# Patient Record
Sex: Female | Born: 1970 | Race: White | Hispanic: No | Marital: Married | Smoking: Never smoker
Health system: Southern US, Community
[De-identification: ages and names within clinical notes are randomized; demographics above are authoritative.]

## PROBLEM LIST (undated history)

## (undated) DIAGNOSIS — I10 Essential (primary) hypertension: Secondary | ICD-10-CM

## (undated) DIAGNOSIS — Z8639 Personal history of other endocrine, nutritional and metabolic disease: Secondary | ICD-10-CM

## (undated) DIAGNOSIS — H409 Unspecified glaucoma: Secondary | ICD-10-CM

## (undated) DIAGNOSIS — N809 Endometriosis, unspecified: Secondary | ICD-10-CM

## (undated) DIAGNOSIS — F418 Other specified anxiety disorders: Secondary | ICD-10-CM

## (undated) DIAGNOSIS — N8110 Cystocele, unspecified: Secondary | ICD-10-CM

## (undated) HISTORY — DX: Cystocele, unspecified: N81.10

## (undated) HISTORY — DX: Unspecified glaucoma: H40.9

## (undated) HISTORY — DX: Personal history of other endocrine, nutritional and metabolic disease: Z86.39

## (undated) HISTORY — DX: Essential (primary) hypertension: I10

## (undated) HISTORY — DX: Other specified anxiety disorders: F41.8

---
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
## 2009-05-31 DIAGNOSIS — Z8639 Personal history of other endocrine, nutritional and metabolic disease: Secondary | ICD-10-CM | POA: Insufficient documentation

## 2009-05-31 HISTORY — DX: Personal history of other endocrine, nutritional and metabolic disease: Z86.39

## 2012-04-17 ENCOUNTER — Encounter: Payer: Self-pay | Admitting: Family

## 2012-04-17 ENCOUNTER — Ambulatory Visit (INDEPENDENT_AMBULATORY_CARE_PROVIDER_SITE_OTHER): Payer: BC Managed Care – PPO | Admitting: Family

## 2012-04-17 ENCOUNTER — Other Ambulatory Visit: Payer: Self-pay | Admitting: Family

## 2012-04-17 VITALS — BP 112/86 | HR 79 | Temp 98.5°F | Resp 16 | Ht 62.0 in | Wt 192.1 lb

## 2012-04-17 DIAGNOSIS — E041 Nontoxic single thyroid nodule: Secondary | ICD-10-CM | POA: Insufficient documentation

## 2012-04-17 DIAGNOSIS — Z1231 Encounter for screening mammogram for malignant neoplasm of breast: Secondary | ICD-10-CM

## 2012-04-17 DIAGNOSIS — H409 Unspecified glaucoma: Secondary | ICD-10-CM | POA: Insufficient documentation

## 2012-04-17 DIAGNOSIS — Z23 Encounter for immunization: Secondary | ICD-10-CM

## 2012-04-17 DIAGNOSIS — Z0000 Encounter for general adult medical examination without abnormal findings: Secondary | ICD-10-CM

## 2012-04-17 HISTORY — DX: Unspecified glaucoma: H40.9

## 2012-04-17 MED ORDER — FLUTICASONE PROPIONATE 50 MCG/ACT NA SUSP: 2.0000 | Freq: Every day | NASAL | Status: DC | PRN

## 2012-04-17 NOTE — Patient Instructions (Addendum)
Schedule mammogram and ultrasound on the floor. Return to the lab fasting for fasting blood work.  Follow up as needed.

## 2012-04-17 NOTE — Progress Notes (Signed)
Subjective:    Patient ID: Maria [name], female    DOB: Oct 26, 1970, 41 y.o.   MRN: [id number]  HPI  Patient presents today for complete physical.  Immunizations: last tetanus?, wants flu shot Diet: fair Exercise: Not currently exercising.  Pap Smear: 1 year ago- reports normal pap smear. Mammogram: Last mammogram- >1 year  Hx of thyroid nodule- this was reported on [place] 2/11. Was told that it was "small.  Never did have a follow up US for monitoring.   Review of Systems  Constitutional: Negative for unexpected weight change.  HENT: Negative for congestion.   Eyes: Negative for visual disturbance.       Reports hx of lasik 10 yrs ago. No longer 20/20  Gastrointestinal: Negative for vomiting, diarrhea and constipation.  Genitourinary: Negative for dysuria.  Musculoskeletal: Negative for myalgias and arthralgias.  Skin: Negative for rash.  Neurological: Negative for headaches.       Reports + sinus headaches.  Uses flonase. Gets headaches once a month with her periods  Hematological: Negative for adenopathy.  Psychiatric/Behavioral:       Denies depression anxiety   Past Medical History  Diagnosis Date  . History of thyroid nodule 2011  . 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 Marital Status: Married    Spouse Name: N/A    Number of Children: 2  . Years of Education: N/A   Occupational History  . Not on file.   Social History Main Topics  . Smoking status: Never Smoker   . Smokeless tobacco: Never Used  . Alcohol Use: Yes     Comment: social /occasional  . Drug Use: Not on file  . Sexually Active: Not on file   Other Topics Concern  . Not on file   Social History Narrative   Regular exercise:  Walks once a weekCaffeine use:  1-2 cups coffee dailyShe works from home for [employer] crafting/crocheting/[employer]    Past Surgical History  Procedure Date  . No past surgeries     Family History  Problem  Relation Age of Onset  . Hyperlipidemia Maternal Grandmother   . Hypertension Maternal Grandmother   . Diabetes Maternal Grandfather   . Psoriasis Maternal Grandfather   . Heart disease Maternal Grandfather   . Heart disease Paternal Grandfather   . Cancer Neg Hx     Allergies  Allergen Reactions  . Asa (Aspirin) Nausea And Vomiting    Current Outpatient Prescriptions on File Prior to Visit  Medication Sig Dispense Refill  . fluticasone (FLONASE) 50 MCG/ACT nasal spray Place 2 sprays into the nose daily as needed.        BP 112/86  Pulse 79  Temp 98.5 F (36.9 C) (Oral)  Resp 16  Ht 5\' 2"  (1.575 m)  Wt 192 lb 1.9 oz (87.145 kg)  BMI 35.14 kg/m2  SpO2 98%  LMP 03/17/2012       Objective:   Physical Exam Physical Exam  Constitutional: She is oriented to person, place, and time. She appears well-developed and well-nourished. No distress.  HENT:  Head: Normocephalic and atraumatic.  Right Ear: Tympanic membrane and ear canal normal.  Left Ear: Tympanic membrane and ear canal normal.  Mouth/Throat: Oropharynx is clear and moist.  Eyes: Pupils are equal, round, and reactive to light. No scleral icterus.  Neck: Normal range of motion. No thyromegaly present.  Cardiovascular: Normal rate and regular rhythm.   No murmur heard.  Pulmonary/Chest: Effort normal and breath sounds normal. No respiratory distress. He has no wheezes. She has no rales. She exhibits no tenderness.  Abdominal: Soft. Bowel sounds are normal. He exhibits no distension and no mass. There is no tenderness. There is no rebound and no guarding.  Musculoskeletal: She exhibits no edema.  Lymphadenopathy:    She has no cervical adenopathy.  Neurological: She is alert and oriented to person, place, and time. She has normal reflexes. She exhibits normal muscle tone. Coordination normal.  Skin: Skin is warm and dry.  Psychiatric: She has a normal mood and affect. Her behavior is normal. Judgment and thought  content normal.  Breasts: Examined lying Right: Without masses, retractions, discharge or axillary adenopathy.  Left: Without masses, retractions, discharge or axillary adenopathy.  Pelvic: deferred          Assessment & Plan:          Assessment & Plan:

## 2012-04-17 NOTE — Assessment & Plan Note (Signed)
Pt counseled on diet, exercise and weight loss. Will refer for mammogram.  She will return fasting for blood work.  Plan pap next year.  Obtain old records.  She thinks last tetanus is <10 years ago.

## 2012-04-17 NOTE — Assessment & Plan Note (Signed)
Obtain old records, will order follow up ultrasound.

## 2012-04-25 ENCOUNTER — Ambulatory Visit (HOSPITAL_BASED_OUTPATIENT_CLINIC_OR_DEPARTMENT_OTHER)
Admission: RE | Admit: 2012-04-25 | Discharge: 2012-04-25 | Disposition: A | Discharge status: Home / Self Care | Payer: BC Managed Care – PPO | Source: Ambulatory Visit | Attending: Family | Admitting: Family

## 2012-04-25 ENCOUNTER — Other Ambulatory Visit (HOSPITAL_BASED_OUTPATIENT_CLINIC_OR_DEPARTMENT_OTHER): Payer: BC Managed Care – PPO

## 2012-04-25 ENCOUNTER — Encounter: Payer: Self-pay | Admitting: Family

## 2012-04-25 DIAGNOSIS — Z1231 Encounter for screening mammogram for malignant neoplasm of breast: Secondary | ICD-10-CM

## 2012-04-25 DIAGNOSIS — E041 Nontoxic single thyroid nodule: Secondary | ICD-10-CM

## 2012-04-25 LAB — CBC WITH DIFFERENTIAL/PLATELET
Basophils Absolute: 0 10*3/uL (ref 0.0–0.1)
Eosinophils Absolute: 0.2 10*3/uL (ref 0.0–0.7)
Lymphocytes Relative: 25 % (ref 12–46)
Lymphs Abs: 2.2 10*3/uL (ref 0.7–4.0)
Neutrophils Relative %: 68 % (ref 43–77)
Platelets: 331 10*3/uL (ref 150–400)
RBC: 4.88 MIL/uL (ref 3.87–5.11)
WBC: 8.6 10*3/uL (ref 4.0–10.5)

## 2012-04-25 LAB — BASIC METABOLIC PANEL WITH GFR
Calcium: 9.1 mg/dL (ref 8.4–10.5)
GFR, Est African American: >89 mL/min
Sodium: 139 mEq/L (ref 135–145)

## 2012-04-25 LAB — HEPATIC FUNCTION PANEL
Albumin: 3.9 g/dL (ref 3.5–5.2)
Alkaline Phosphatase: 55 U/L (ref 39–117)
Total Bilirubin: 0.5 mg/dL (ref 0.3–1.2)

## 2012-04-25 LAB — LIPID PANEL
Cholesterol: 195 mg/dL (ref 0–200)
HDL: 39 mg/dL — ABNORMAL LOW (ref >39)
LDL Cholesterol: 120 mg/dL — ABNORMAL HIGH (ref 0–99)
Triglycerides: 181 mg/dL — ABNORMAL HIGH (ref <150)

## 2012-04-26 ENCOUNTER — Telehealth: Payer: Self-pay | Admitting: Family

## 2012-04-26 LAB — URINALYSIS, ROUTINE W REFLEX MICROSCOPIC
Bilirubin Urine: NEGATIVE
Glucose, UA: NEGATIVE mg/dL
Ketones, ur: NEGATIVE mg/dL
Nitrite: NEGATIVE
pH: 5.5 (ref 5.0–8.0)

## 2012-04-26 LAB — URINALYSIS, MICROSCOPIC ONLY
Bacteria, UA: NONE SEEN
Casts: NONE SEEN

## 2012-04-26 NOTE — Telephone Encounter (Signed)
When pt returns call re: thyroid US, please also let her know that radiology is requesting some additional images of the left breast.  They should be contacting her within the next week to arrange these additional studies.  All of her labs look good, except triglycerides are mildly elevated. She can work on this by avoiding concentrated sweets and white fluffy carbs.

## 2012-04-26 NOTE — Telephone Encounter (Signed)
Left message on home # to return my call. 

## 2012-04-28 ENCOUNTER — Other Ambulatory Visit: Payer: Self-pay | Admitting: Family

## 2012-04-28 DIAGNOSIS — R928 Other abnormal and inconclusive findings on diagnostic imaging of breast: Secondary | ICD-10-CM

## 2012-05-05 ENCOUNTER — Ambulatory Visit (HOSPITAL_BASED_OUTPATIENT_CLINIC_OR_DEPARTMENT_OTHER): Payer: BC Managed Care – PPO

## 2012-05-05 ENCOUNTER — Inpatient Hospital Stay (HOSPITAL_BASED_OUTPATIENT_CLINIC_OR_DEPARTMENT_OTHER): Admission: RE | Admit: 2012-05-05 | Payer: BC Managed Care – PPO | Source: Ambulatory Visit

## 2012-05-08 NOTE — Telephone Encounter (Signed)
Notified pt. 

## 2012-05-09 ENCOUNTER — Ambulatory Visit
Admission: RE | Admit: 2012-05-09 | Discharge: 2012-05-09 | Disposition: A | Discharge status: Home / Self Care | Payer: BC Managed Care – PPO | Source: Ambulatory Visit | Attending: Family | Admitting: Family

## 2012-05-09 DIAGNOSIS — R928 Other abnormal and inconclusive findings on diagnostic imaging of breast: Secondary | ICD-10-CM

## 2013-06-27 ENCOUNTER — Other Ambulatory Visit: Payer: Self-pay | Admitting: Family

## 2013-06-27 ENCOUNTER — Other Ambulatory Visit (HOSPITAL_BASED_OUTPATIENT_CLINIC_OR_DEPARTMENT_OTHER): Payer: Self-pay | Admitting: Family

## 2013-06-27 DIAGNOSIS — Z1231 Encounter for screening mammogram for malignant neoplasm of breast: Secondary | ICD-10-CM

## 2013-07-04 ENCOUNTER — Ambulatory Visit (HOSPITAL_BASED_OUTPATIENT_CLINIC_OR_DEPARTMENT_OTHER)
Admission: RE | Admit: 2013-07-04 | Discharge: 2013-07-04 | Disposition: A | Discharge status: Home / Self Care | Payer: Managed Care, Other (non HMO) | Source: Ambulatory Visit | Attending: Family | Admitting: Family

## 2013-07-04 DIAGNOSIS — Z1231 Encounter for screening mammogram for malignant neoplasm of breast: Secondary | ICD-10-CM | POA: Insufficient documentation

## 2014-03-28 ENCOUNTER — Encounter: Payer: Self-pay | Admitting: Family

## 2014-03-28 ENCOUNTER — Ambulatory Visit (INDEPENDENT_AMBULATORY_CARE_PROVIDER_SITE_OTHER): Payer: Managed Care, Other (non HMO) | Admitting: Family

## 2014-03-28 VITALS — BP 145/94 | HR 78 | Temp 98.2°F | Wt 200.0 lb

## 2014-03-28 DIAGNOSIS — Z23 Encounter for immunization: Secondary | ICD-10-CM

## 2014-03-28 DIAGNOSIS — I10 Essential (primary) hypertension: Secondary | ICD-10-CM

## 2014-03-28 MED ORDER — HYDROCHLOROTHIAZIDE 25 MG PO TABS: 25.0000 mg | ORAL_TABLET | Freq: Every day | ORAL | Status: DC

## 2014-03-28 NOTE — Progress Notes (Signed)
   Subjective:    Patient ID: Maria [name], female    DOB: 08/29/1970, 43 y.o.   MRN: [id number]  HPI  Ms. Maria [name] is a 43 yr old female who presents today to discuss elevated blood pressure.  She completed a Wellness screening for work yesterday and had the following BP's,174/101 161/114, 149/108.  BP Readings from Last 3 Encounters:  03/28/14 145/94  04/17/12 112/86   Was taking sinus headache medication- took Tuesday AM  She reports that she has gained some weight, walks the dogs but no formal exercise. Wt Readings from Last 3 Encounters:  03/28/14 200 lb (90.719 kg)  04/17/12 192 lb 1.9 oz (87.145 kg)     Review of Systems  Respiratory: Negative for shortness of breath.   Cardiovascular: Negative for chest pain.  Neurological: Positive for headaches.   Past Medical History  Diagnosis Date  . History of thyroid nodule 2011  . 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 Marital Status: Married    Spouse Name: N/A    Number of Children: 2  . Years of Education: N/A   Occupational History  . Not on file.   Social History Main Topics  . Smoking status: Never Smoker   . Smokeless tobacco: Never Used  . Alcohol Use: Yes     Comment: social /occasional  . Drug Use: Not on file  . Sexual Activity: Not on file   Other Topics Concern  . Not on file   Social History Narrative   Regular exercise:  Walks once a week   Caffeine use:  1-2 cups coffee daily   She works from home for [employer]   Married   2 children   Enjoys crafting/crocheting/[employer]ascar    Past Surgical History  Procedure Laterality Date  . No past surgeries      Family History  Problem Relation Age of Onset  . Hyperlipidemia Maternal Grandmother   . Hypertension Maternal Grandmother   . Diabetes Maternal Grandfather   . Psoriasis Maternal Grandfather   . Heart disease Maternal Grandfather   . Heart disease Paternal Grandfather   . Cancer Neg Hx     Allergies    Allergen Reactions  . Asa [Aspirin] Nausea And Vomiting    Current Outpatient Prescriptions on File Prior to Visit  Medication Sig Dispense Refill  . fluticasone (FLONASE) 50 MCG/ACT nasal spray Place 2 sprays into the nose daily as needed.  16 g  5   No current facility-administered medications on file prior to visit.    BP 145/94  Pulse 78  Temp(Src) 98.2 F (36.8 C)  Wt 200 lb (90.719 kg)  SpO2 98%       Objective:   Physical Exam  Constitutional: She is oriented to person, place, and time. She appears well-developed and well-nourished. No distress.  Cardiovascular: Normal rate and regular rhythm.   No murmur heard. Pulmonary/Chest: Effort normal and breath sounds normal. No respiratory distress. She has no wheezes. She has no rales. She exhibits no tenderness.  Musculoskeletal: She exhibits no edema.  Neurological: She is alert and oriented to person, place, and time.  Psychiatric: She has a normal mood and affect. Her behavior is normal. Judgment and thought content normal.          Assessment & Plan:

## 2014-03-28 NOTE — Assessment & Plan Note (Signed)
New. Advised pt to avoid medications containing decongestants, focus on low sodium diet, exercise, weight loss. Will initiate hctz, follow up in 2 weeks for BP recheck and BMET.

## 2014-03-28 NOTE — Patient Instructions (Addendum)
Start hctz once daily.  Work on low sodium diet, exercise, weight loss.  Follow up in 2 weeks.

## 2014-03-28 NOTE — Progress Notes (Signed)
Pre visit review using our clinic review tool, if applicable. No additional management support is needed unless otherwise documented below in the visit note. 

## 2014-04-30 ENCOUNTER — Other Ambulatory Visit (HOSPITAL_COMMUNITY)
Admission: RE | Admit: 2014-04-30 | Discharge: 2014-04-30 | Disposition: A | Discharge status: Home / Self Care | Payer: Managed Care, Other (non HMO) | Source: Ambulatory Visit | Attending: Family | Admitting: Family

## 2014-04-30 ENCOUNTER — Ambulatory Visit (INDEPENDENT_AMBULATORY_CARE_PROVIDER_SITE_OTHER): Payer: Managed Care, Other (non HMO) | Admitting: Family

## 2014-04-30 ENCOUNTER — Encounter: Payer: Self-pay | Admitting: Family

## 2014-04-30 VITALS — BP 130/90 | HR 76 | Temp 98.1°F | Resp 16 | Ht 63.0 in | Wt 205.4 lb

## 2014-04-30 DIAGNOSIS — Z0000 Encounter for general adult medical examination without abnormal findings: Secondary | ICD-10-CM

## 2014-04-30 DIAGNOSIS — Z01419 Encounter for gynecological examination (general) (routine) without abnormal findings: Secondary | ICD-10-CM | POA: Insufficient documentation

## 2014-04-30 DIAGNOSIS — I10 Essential (primary) hypertension: Secondary | ICD-10-CM

## 2014-04-30 DIAGNOSIS — Z23 Encounter for immunization: Secondary | ICD-10-CM

## 2014-04-30 LAB — URINALYSIS, ROUTINE W REFLEX MICROSCOPIC
BILIRUBIN URINE: NEGATIVE
Hgb urine dipstick: NEGATIVE
Ketones, ur: NEGATIVE
LEUKOCYTES UA: NEGATIVE
NITRITE: NEGATIVE
PH: 6.5 (ref 5.0–8.0)
RBC / HPF: NONE SEEN (ref 0–?)
SPECIFIC GRAVITY, URINE: 1.015 (ref 1.000–1.030)
Total Protein, Urine: NEGATIVE
UROBILINOGEN UA: 0.2 (ref 0.0–1.0)
Urine Glucose: NEGATIVE
WBC, UA: NONE SEEN (ref 0–?)

## 2014-04-30 LAB — BASIC METABOLIC PANEL
BUN: 9 mg/dL (ref 6–23)
CHLORIDE: 105 meq/L (ref 96–112)
CO2: 24 meq/L (ref 19–32)
Calcium: 8.4 mg/dL (ref 8.4–10.5)
Creatinine, Ser: 0.7 mg/dL (ref 0.4–1.2)
GFR: 90.34 mL/min (ref >60.00)
GLUCOSE: 97 mg/dL (ref 70–99)
POTASSIUM: 3.8 meq/L (ref 3.5–5.1)
SODIUM: 137 meq/L (ref 135–145)

## 2014-04-30 LAB — CBC WITH DIFFERENTIAL/PLATELET
BASOS ABS: 0 10*3/uL (ref 0.0–0.1)
Basophils Relative: 0.5 % (ref 0.0–3.0)
EOS ABS: 0.2 10*3/uL (ref 0.0–0.7)
Eosinophils Relative: 2.1 % (ref 0.0–5.0)
HEMATOCRIT: 38.7 % (ref 36.0–46.0)
HEMOGLOBIN: 12.6 g/dL (ref 12.0–15.0)
LYMPHS ABS: 2 10*3/uL (ref 0.7–4.0)
Lymphocytes Relative: 24.2 % (ref 12.0–46.0)
MCHC: 32.6 g/dL (ref 30.0–36.0)
MCV: 87.6 fl (ref 78.0–100.0)
Monocytes Absolute: 0.4 10*3/uL (ref 0.1–1.0)
Monocytes Relative: 5 % (ref 3.0–12.0)
NEUTROS ABS: 5.8 10*3/uL (ref 1.4–7.7)
Neutrophils Relative %: 68.2 % (ref 43.0–77.0)
Platelets: 297 10*3/uL (ref 150.0–400.0)
RBC: 4.42 Mil/uL (ref 3.87–5.11)
RDW: 13.5 % (ref 11.5–15.5)
WBC: 8.5 10*3/uL (ref 4.0–10.5)

## 2014-04-30 LAB — HEPATIC FUNCTION PANEL
ALBUMIN: 3.6 g/dL (ref 3.5–5.2)
ALT: 25 U/L (ref 0–35)
AST: 19 U/L (ref 0–37)
Alkaline Phosphatase: 55 U/L (ref 39–117)
Bilirubin, Direct: 0 mg/dL (ref 0.0–0.3)
TOTAL PROTEIN: 6.6 g/dL (ref 6.0–8.3)
Total Bilirubin: 0.4 mg/dL (ref 0.2–1.2)

## 2014-04-30 LAB — TSH: TSH: 3.03 u[IU]/mL (ref 0.35–4.50)

## 2014-04-30 MED ORDER — AMLODIPINE BESYLATE 5 MG PO TABS: 2.5000 mg | ORAL_TABLET | Freq: Every day | ORAL | Status: DC

## 2014-04-30 NOTE — Addendum Note (Signed)
Addended by: Silvio PateHOMPSON, Janett Kamath D on: 04/30/2014 08:28 AM   Modules accepted: Orders

## 2014-04-30 NOTE — Assessment & Plan Note (Signed)
Discussed healthy diet, exercise, weight loss.  Tdap today. Pap performed today.

## 2014-04-30 NOTE — Assessment & Plan Note (Signed)
Home dbp readings have been >90 on most readings. Will continue hctz, add amlodipine 2.5mg  once daily. Pt to check daily x 1 week and send me her readings via mychart.

## 2014-04-30 NOTE — Patient Instructions (Addendum)
Add amlodipine 5 mg 1/2 tab by mouth once daily. Continue hctz.   Follow up in 3 months.

## 2014-04-30 NOTE — Progress Notes (Signed)
Subjective:    Patient ID: Maria [name], female    DOB: 05/03/1971, 43 y.o.   MRN: [id number]  HPI  Maria [name] is a 43 yr old female who presents today for cpx.  Patient presents today for complete physical.  Immunizations: up to date.  Diet: portion control, plans to work hard on this 1/1 Exercise:  Walks her dog at lunch and before/after dinner- 1 mile a day Pap Smear: due, periods  Mammogram: 2/15- normal.   Eye exam: up to date Dental: up to date  BP Readings from Last 3 Encounters:  03/28/14 145/94  04/17/12 112/86    Started hctz.  Home readings  + snoring- dentist concerned re: grinding and grinding of teeth and possibility of sleep apnea- has been given a night guard. Wakes up feeling rested. Takes HS melatonin which helps.  Does not doze off.  Occasional weekend naps. Declines sleep study at this time.    Review of Systems  Constitutional: Negative for unexpected weight change.       Wt Readings from Last 3 Encounters: 03/28/14 : 200 lb (90.719 kg) 04/17/12 : 192 lb 1.9 oz (87.145 kg)   HENT: Negative for hearing loss and rhinorrhea.   Eyes: Negative for visual disturbance.  Respiratory: Negative for cough and shortness of breath.   Cardiovascular: Negative for chest pain.  Gastrointestinal: Negative for nausea, vomiting and diarrhea.  Genitourinary: Negative for dysuria, frequency and menstrual problem.  Musculoskeletal: Negative for myalgias and arthralgias.  Skin: Negative for rash.  Neurological: Negative for headaches.  Hematological: Negative for adenopathy.  Psychiatric/Behavioral:       Denies depression/anxiety       Past Medical History  Diagnosis Date  . History of thyroid nodule 2011  . 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 History of thyroid nodule 2011  . 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 Marital Status: Married    Spouse Name: N/A   Number of Children: 2  . Years of Education: N/A   Occupational History  . Not on file.   Social History Main Topics  . Smoking status: Never Smoker   . Smokeless tobacco: Never Used  . Alcohol Use: Yes     Comment: social /occasional  . Drug Use: Not on file  . Sexual Activity: Not on file   Other Topics Concern  . Not on file   Social History Narrative   Regular exercise:  Walks once a week   Caffeine use:  1-2 cups coffee daily   She works from home for [employer]   Married   2 children   Enjoys crafting/crocheting/[employer]    Past Surgical History  Procedure Laterality Date  . No past surgeries      Family History  Problem Relation Age of Onset  . Hyperlipidemia Maternal Grandmother   . Hypertension Maternal Grandmother   . Diabetes Maternal Grandfather   . Psoriasis Maternal Grandfather   . Heart disease Maternal Grandfather   . Heart disease Paternal Grandfather   . Cancer Father     prostate     Allergies  Allergen Reactions  . Asa [Aspirin] Nausea And Vomiting    Current Outpatient Prescriptions on File Prior to Visit  Medication Sig Dispense Refill  . fluticasone (FLONASE) 50 MCG/ACT nasal spray Place 2 sprays into the nose daily as needed. 16 g 5  .  hydrochlorothiazide (HYDRODIURIL) 25 MG tablet Take 1 tablet (25 mg total) by mouth daily. 30 tablet 0   No current facility-administered medications on file prior to visit.    BP 130/90 mmHg  Pulse 76  Temp(Src) 98.1 F (36.7 C) (Oral)  Resp 16  Ht 5\' 3"  (1.6 m)  Wt 205 lb 6.4 oz (93.169 kg)  BMI 36.39 kg/m2  SpO2 99%  LMP 04/15/2014     Objective:   Physical Exam  Physical Exam  Constitutional: She is oriented to person, place, and time. She appears well-developed and well-nourished. No distress.  HENT:  Head: Normocephalic and atraumatic.  Right Ear: Tympanic membrane and ear canal normal.  Left Ear: Tympanic membrane and ear canal normal.  Mouth/Throat: Oropharynx is clear and moist.    Eyes: Pupils are equal, round, and reactive to light. No scleral icterus.  Neck: Normal range of motion. No thyromegaly present.  Cardiovascular: Normal rate and regular rhythm.   No murmur heard. Pulmonary/Chest: Effort normal and breath sounds normal. No respiratory distress. He has no wheezes. She has no rales. She exhibits no tenderness.  Abdominal: Soft. Bowel sounds are normal. He exhibits no distension and no mass. There is no tenderness. There is no rebound and no guarding.  Musculoskeletal: She exhibits no edema.  Lymphadenopathy:    She has no cervical adenopathy.  Neurological: She is alert and oriented to person, place, and time. She exhibits normal muscle tone. Coordination normal.  Skin: Skin is warm and dry.  Psychiatric: She has a normal mood and affect. Her behavior is normal. Judgment and thought content normal.  Breasts: Examined lying Right: Without masses, retractions, discharge or axillary adenopathy.  Left: Without masses, retractions, discharge or axillary adenopathy.  Inguinal/mons: Normal without inguinal adenopathy  External genitalia: Normal  BUS/Urethra/Skene's glands: Normal  Bladder: Normal  Vagina: Normal  Cervix: Normal  Uterus: normal in size, shape and contour. Midline and mobile  Adnexa/parametria:  Rt: Without masses or tenderness.  Lt: Without masses or tenderness.  Anus and perineum: Normal           Assessment & Plan:        Assessment & Plan:

## 2014-04-30 NOTE — Addendum Note (Signed)
Addended by: Mervin KungFERGERSON, Ulysess Witz A on: 04/30/2014 08:57 AM   Modules accepted: Orders

## 2014-05-01 ENCOUNTER — Telehealth: Payer: Self-pay | Admitting: Family

## 2014-05-01 ENCOUNTER — Other Ambulatory Visit: Payer: Self-pay | Admitting: Family

## 2014-05-01 LAB — CYTOLOGY - PAP

## 2014-05-01 MED ORDER — HYDROCHLOROTHIAZIDE 25 MG PO TABS: 25.0000 mg | ORAL_TABLET | Freq: Every day | ORAL | Status: DC

## 2014-05-01 NOTE — Telephone Encounter (Signed)
Left detailed message on cell and to call if any questions. 

## 2014-05-01 NOTE — Telephone Encounter (Signed)
Yes she needs both meds please. Refill sent.

## 2014-05-01 NOTE — Telephone Encounter (Signed)
,  Caller name: Kipp BroodWebb, Lillyonna Relation to pt: self Call back number:[phone] Pharmacy:[facility] [address]  Reason for call: pt states she need clarification if she should stay on her rx hydrochlorothiazide (HYDRODIURIL) 25 MG tablet pt states per her conversation with Efraim KaufmannMelissa she was to take both pills for her bp. Pt states she is out of the med so she would need it called in if she needs to continue taking it.

## 2014-05-02 ENCOUNTER — Encounter: Payer: Self-pay | Admitting: Family

## 2014-05-27 ENCOUNTER — Telehealth: Payer: Self-pay

## 2014-05-27 MED ORDER — HYDROCHLOROTHIAZIDE 25 MG PO TABS: 25.0000 mg | ORAL_TABLET | Freq: Every day | ORAL | Status: DC

## 2014-05-27 NOTE — Telephone Encounter (Signed)
Hydrochlorothiazide 25 mg tablets--Take 1 tablet (25 mg total) by mouth daily. - Oral  Request sent for a 90 day supply.    [facility] [address] [place]

## 2014-06-14 ENCOUNTER — Telehealth: Payer: Self-pay | Admitting: *Deleted

## 2014-06-14 NOTE — Telephone Encounter (Signed)
-----   Message from Sandford CrazeMelissa O'Sullivan, NP sent at 05/02/2014 10:24 PM EST ----- Regarding: FW: Your message may not be read Contact: [phone]   ----- Message -----    From: Generic Mychart    Sent: 05/02/2014  10:05 PM      To: Sandford CrazeMelissa O'Sullivan, NP Subject: Your message may not be read                      ----- Delivery failure of internet email alert     ----- Failed to log attempted delivery of internet email alert  Tickler type: Message Message Id(WMG): [id number] SMTP Response: [id number] Patient: [name],Maria([id number]) Recipient: () Internet alert email: [email or web address]     ----- Original WMG message to the patient ----- Sent: 05/02/2014  9:26 PM From: Sandford CrazeMelissa O'Sullivan, NP To: [facility],Mykel Message Type: User Message Subject: Pap  Gavin Poundeborah,  Pap smear is negative. Good news.  Plan to repeat in 3 years.  Melissa

## 2014-06-14 NOTE — Telephone Encounter (Signed)
Mailed letter to pt

## 2015-05-02 ENCOUNTER — Emergency Department (HOSPITAL_BASED_OUTPATIENT_CLINIC_OR_DEPARTMENT_OTHER): Payer: Managed Care, Other (non HMO)

## 2015-05-02 ENCOUNTER — Emergency Department (HOSPITAL_BASED_OUTPATIENT_CLINIC_OR_DEPARTMENT_OTHER)
Admission: EM | Admit: 2015-05-02 | Discharge: 2015-05-03 | Disposition: A | Discharge status: Home / Self Care | Payer: Managed Care, Other (non HMO) | Attending: Emergency Medicine | Admitting: Emergency Medicine

## 2015-05-02 ENCOUNTER — Encounter (HOSPITAL_BASED_OUTPATIENT_CLINIC_OR_DEPARTMENT_OTHER): Payer: Self-pay | Admitting: *Deleted

## 2015-05-02 DIAGNOSIS — R140 Abdominal distension (gaseous): Secondary | ICD-10-CM | POA: Insufficient documentation

## 2015-05-02 DIAGNOSIS — Z79899 Other long term (current) drug therapy: Secondary | ICD-10-CM | POA: Diagnosis not present

## 2015-05-02 DIAGNOSIS — R1084 Generalized abdominal pain: Secondary | ICD-10-CM

## 2015-05-02 DIAGNOSIS — N730 Acute parametritis and pelvic cellulitis: Secondary | ICD-10-CM | POA: Diagnosis not present

## 2015-05-02 DIAGNOSIS — Z8669 Personal history of other diseases of the nervous system and sense organs: Secondary | ICD-10-CM | POA: Diagnosis not present

## 2015-05-02 DIAGNOSIS — Z8639 Personal history of other endocrine, nutritional and metabolic disease: Secondary | ICD-10-CM | POA: Diagnosis not present

## 2015-05-02 DIAGNOSIS — N83209 Unspecified ovarian cyst, unspecified side: Secondary | ICD-10-CM | POA: Insufficient documentation

## 2015-05-02 LAB — CBC WITH DIFFERENTIAL/PLATELET
Basophils Absolute: 0 10*3/uL (ref 0.0–0.1)
Basophils Relative: 0 %
EOS ABS: 0.1 10*3/uL (ref 0.0–0.7)
EOS PCT: 1 %
HCT: 39 % (ref 36.0–46.0)
Hemoglobin: 12.9 g/dL (ref 12.0–15.0)
LYMPHS ABS: 2.8 10*3/uL (ref 0.7–4.0)
Lymphocytes Relative: 23 %
MCH: 29.1 pg (ref 26.0–34.0)
MCHC: 33.1 g/dL (ref 30.0–36.0)
MCV: 87.8 fL (ref 78.0–100.0)
Monocytes Absolute: 0.6 10*3/uL (ref 0.1–1.0)
Monocytes Relative: 5 %
Neutro Abs: 8.4 10*3/uL — ABNORMAL HIGH (ref 1.7–7.7)
Neutrophils Relative %: 71 %
PLATELETS: 324 10*3/uL (ref 150–400)
RBC: 4.44 MIL/uL (ref 3.87–5.11)
RDW: 13 % (ref 11.5–15.5)
WBC: 11.9 10*3/uL — AB (ref 4.0–10.5)

## 2015-05-02 LAB — WET PREP, GENITAL
Clue Cells Wet Prep HPF POC: NONE SEEN
Sperm: NONE SEEN
Trich, Wet Prep: NONE SEEN
YEAST WET PREP: NONE SEEN

## 2015-05-02 LAB — BASIC METABOLIC PANEL
Anion gap: 9 (ref 5–15)
BUN: 14 mg/dL (ref 6–20)
CO2: 26 mmol/L (ref 22–32)
CREATININE: 0.87 mg/dL (ref 0.44–1.00)
Calcium: 8.8 mg/dL — ABNORMAL LOW (ref 8.9–10.3)
Chloride: 104 mmol/L (ref 101–111)
GFR calc Af Amer: >60 mL/min (ref >60)
Glucose, Bld: 149 mg/dL — ABNORMAL HIGH (ref 65–99)
POTASSIUM: 3.4 mmol/L — AB (ref 3.5–5.1)
SODIUM: 139 mmol/L (ref 135–145)

## 2015-05-02 LAB — URINALYSIS, ROUTINE W REFLEX MICROSCOPIC
BILIRUBIN URINE: NEGATIVE
Glucose, UA: NEGATIVE mg/dL
KETONES UR: NEGATIVE mg/dL
Leukocytes, UA: NEGATIVE
Nitrite: NEGATIVE
PROTEIN: NEGATIVE mg/dL
Specific Gravity, Urine: 1.008 (ref 1.005–1.030)
pH: 6 (ref 5.0–8.0)

## 2015-05-02 LAB — URINE MICROSCOPIC-ADD ON: WBC, UA: NONE SEEN WBC/hpf (ref 0–5)

## 2015-05-02 MED ORDER — IOHEXOL 300 MG/ML  SOLN
50.0000 mL | Freq: Once | INTRAMUSCULAR | Status: AC | PRN
  Administered 2015-05-02: 50 mL via INTRAVENOUS

## 2015-05-02 MED ORDER — SODIUM CHLORIDE 0.9 % IV BOLUS (SEPSIS)
500.0000 mL | Freq: Once | INTRAVENOUS | Status: AC
  Administered 2015-05-02: 500 mL via INTRAVENOUS

## 2015-05-02 MED ORDER — ONDANSETRON HCL 4 MG/2ML IJ SOLN
4.0000 mg | Freq: Once | INTRAMUSCULAR | Status: AC
  Administered 2015-05-02: 4 mg via INTRAVENOUS
  Filled 2015-05-02: qty 2

## 2015-05-02 MED ORDER — DEXTROSE 5 % IV SOLN
250.0000 mg | Freq: Once | INTRAVENOUS | Status: AC
  Administered 2015-05-02: 250 mg via INTRAVENOUS
  Filled 2015-05-02: qty 250

## 2015-05-02 MED ORDER — AZITHROMYCIN 250 MG PO TABS
1000.0000 mg | ORAL_TABLET | Freq: Once | ORAL | Status: AC
  Administered 2015-05-02: 1000 mg via ORAL
  Filled 2015-05-02: qty 4

## 2015-05-02 MED ORDER — DOXYCYCLINE HYCLATE 100 MG PO CAPS: 100.0000 mg | ORAL_CAPSULE | Freq: Two times a day (BID) | ORAL | Status: DC

## 2015-05-02 MED ORDER — MORPHINE SULFATE (PF) 4 MG/ML IV SOLN
4.0000 mg | Freq: Once | INTRAVENOUS | Status: AC
  Administered 2015-05-02: 4 mg via INTRAVENOUS
  Filled 2015-05-02: qty 1

## 2015-05-02 MED ORDER — HYDROCODONE-ACETAMINOPHEN 5-325 MG PO TABS
1.0000 | ORAL_TABLET | Freq: Once | ORAL | Status: AC
  Administered 2015-05-02: 1 via ORAL
  Filled 2015-05-02: qty 1

## 2015-05-02 MED ORDER — CEFTRIAXONE SODIUM 250 MG IJ SOLR
INTRAMUSCULAR | Status: AC
  Filled 2015-05-02: qty 250

## 2015-05-02 MED ORDER — CEFTRIAXONE SODIUM 250 MG IJ SOLR: 250.0000 mg | Freq: Once | INTRAMUSCULAR | Status: DC

## 2015-05-02 MED ORDER — ONDANSETRON HCL 4 MG PO TABS: 4.0000 mg | ORAL_TABLET | Freq: Four times a day (QID) | ORAL | Status: DC

## 2015-05-02 MED ORDER — HYDROCODONE-ACETAMINOPHEN 5-325 MG PO TABS: 1.0000 | ORAL_TABLET | ORAL | Status: DC | PRN

## 2015-05-02 MED ORDER — SIMETHICONE 80 MG PO CHEW: 80.0000 mg | CHEWABLE_TABLET | Freq: Four times a day (QID) | ORAL | Status: DC | PRN

## 2015-05-02 MED ORDER — HYDROMORPHONE HCL 1 MG/ML IJ SOLN
1.0000 mg | Freq: Once | INTRAMUSCULAR | Status: AC
  Administered 2015-05-02: 1 mg via INTRAVENOUS
  Filled 2015-05-02: qty 1

## 2015-05-02 MED ORDER — IOHEXOL 300 MG/ML  SOLN
50.0000 mL | Freq: Once | INTRAMUSCULAR | Status: AC | PRN
  Administered 2015-05-02: 50 mL via ORAL

## 2015-05-02 NOTE — Discharge Instructions (Signed)
Ovarian Cyst An ovarian cyst is a fluid-filled sac that forms on an ovary. The ovaries are small organs that produce eggs in women. Various types of cysts can form on the ovaries. Most are not cancerous. Many do not cause problems, and they often go away on their own. Some may cause symptoms and require treatment. Common types of ovarian cysts include:  Functional cysts--These cysts may occur every month during the menstrual cycle. This is normal. The cysts usually go away with the next menstrual cycle if the woman does not get pregnant. Usually, there are no symptoms with a functional cyst.  Endometrioma cysts--These cysts form from the tissue that lines the uterus. They are also called "chocolate cysts" because they become filled with blood that turns brown. This type of cyst can cause pain in the lower abdomen during intercourse and with your menstrual period.  Cystadenoma cysts--This type develops from the cells on the outside of the ovary. These cysts can get very big and cause lower abdomen pain and pain with intercourse. This type of cyst can twist on itself, cut off its blood supply, and cause severe pain. It can also easily rupture and cause a lot of pain.  Dermoid cysts--This type of cyst is sometimes found in both ovaries. These cysts may contain different kinds of body tissue, such as skin, teeth, hair, or cartilage. They usually do not cause symptoms unless they get very big.  Theca lutein cysts--These cysts occur when too much of a certain hormone (human chorionic gonadotropin) is produced and overstimulates the ovaries to produce an egg. This is most common after procedures used to assist with the conception of a baby (in vitro fertilization). CAUSES   Fertility drugs can cause a condition in which multiple large cysts are formed on the ovaries. This is called ovarian hyperstimulation syndrome.  A condition called polycystic ovary syndrome can cause hormonal imbalances that can lead to  nonfunctional ovarian cysts. SIGNS AND SYMPTOMS  Many ovarian cysts do not cause symptoms. If symptoms are present, they may include:  Pelvic pain or pressure.  Pain in the lower abdomen.  Pain during sexual intercourse.  Increasing girth (swelling) of the abdomen.  Abnormal menstrual periods.  Increasing pain with menstrual periods.  Stopping having menstrual periods without being pregnant. DIAGNOSIS  These cysts are commonly found during a routine or annual pelvic exam. Tests may be ordered to find out more about the cyst. These tests may include:  Ultrasound.  X-ray of the pelvis.  CT scan.  MRI.  Blood tests. TREATMENT  Many ovarian cysts go away on their own without treatment. Your health care provider may want to check your cyst regularly for 2-3 months to see if it changes. For women in menopause, it is particularly important to monitor a cyst closely because of the higher rate of ovarian cancer in menopausal women. When treatment is needed, it may include any of the following:  A procedure to drain the cyst (aspiration). This may be done using a long needle and ultrasound. It can also be done through a laparoscopic procedure. This involves using a thin, lighted tube with a tiny camera on the end (laparoscope) inserted through a small incision.  Surgery to remove the whole cyst. This may be done using laparoscopic surgery or an open surgery involving a larger incision in the lower abdomen.  Hormone treatment or birth control pills. These methods are sometimes used to help dissolve a cyst. HOME CARE INSTRUCTIONS   Only take over-the-counter   or prescription medicines as directed by your health care provider.  Follow up with your health care provider as directed.  Get regular pelvic exams and Pap tests. SEEK MEDICAL CARE IF:   Your periods are late, irregular, or painful, or they stop.  Your pelvic pain or abdominal pain does not go away.  Your abdomen becomes  larger or swollen.  You have pressure on your bladder or trouble emptying your bladder completely.  You have pain during sexual intercourse.  You have feelings of fullness, pressure, or discomfort in your stomach.  You lose weight for no apparent reason.  You feel generally ill.  You become constipated.  You lose your appetite.  You develop acne.  You have an increase in body and facial hair.  You are gaining weight, without changing your exercise and eating habits.  You think you are pregnant. SEEK IMMEDIATE MEDICAL CARE IF:   You have increasing abdominal pain.  You feel sick to your stomach (nauseous), and you throw up (vomit).  You develop a fever that comes on suddenly.  You have abdominal pain during a bowel movement.  Your menstrual periods become heavier than usual. MAKE SURE YOU:  Understand these instructions.  Will watch your condition.  Will get help right away if you are not doing well or get worse.   This information is not intended to replace advice given to you by your health care provider. Make sure you discuss any questions you have with your health care provider.   Document Released: 05/17/2005 Document Revised: 05/22/2013 Document Reviewed: 01/22/2013 Elsevier Interactive Patient Education 2016 [employer]  

## 2015-05-02 NOTE — ED Notes (Signed)
Abdominal pain. Heavy menses with cramping.

## 2015-05-02 NOTE — ED Provider Notes (Addendum)
CSN: [id number]     Arrival date & time 05/02/15  1916 History   First MD Initiated Contact with Patient 05/02/15 1923     Chief Complaint  Patient presents with  . Abdominal Pain     (Consider location/radiation/quality/duration/timing/severity/associated sxs/prior Treatment) HPI   Patient presents to the emergency department with complaints of abdominal pain that started today with nausea. She denies having a history of abdominal pain and says this is very abnormal. Her pain is diffuse but spares the epigastrium. She is also on her menstrual cycle and feels that the bleeding is mildly worse than normal. Her cramping is not severe. She denies having vaginal discharge, dysuria or diarrhea. She has not been febrile or have headaches, weakness, confusion. NO CP, LE swelling.   Past Medical History  Diagnosis Date  . History of thyroid nodule 2011  . 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 No past surgeries     Family History  Problem Relation Age of Onset  . Hyperlipidemia Maternal Grandmother   . Hypertension Maternal Grandmother   . Diabetes Maternal Grandfather   . Psoriasis Maternal Grandfather   . Heart disease Maternal Grandfather   . Heart disease Paternal Grandfather   . Cancer Father     prostate    Social History  Substance Use Topics  . Smoking status: Never Smoker   . Smokeless tobacco: Never Used  . Alcohol Use: Yes     Comment: social /occasional   OB History    No data available     Review of Systems  All other systems reviewed and are negative.   Allergies  Asa  Home Medications   Prior to Admission medications   Medication Sig Start Date End Date Taking? Authorizing Provider  amLODipine (NORVASC) 5 MG tablet Take 0.5 tablets (2.5 mg total) by mouth daily. 04/30/14   Sandford Craze, NP  fluticasone (FLONASE) 50 MCG/ACT nasal spray Place 2 sprays into the nose daily as needed. 04/17/12    Sandford Craze, NP  hydrochlorothiazide (HYDRODIURIL) 25 MG tablet Take 1 tablet (25 mg total) by mouth daily. 05/27/14   Sandford Craze, NP  HYDROcodone-acetaminophen (NORCO/VICODIN) 5-325 MG tablet Take 1-2 tablets by mouth every 4 (four) hours as needed. 05/02/15   Shaynna Husby Neva Seat, PA-C  ondansetron (ZOFRAN) 4 MG tablet Take 1 tablet (4 mg total) by mouth every 6 (six) hours. 05/02/15   Tedra Coppernoll Neva Seat, PA-C  simethicone (GAS-X) 80 MG chewable tablet Chew 1 tablet (80 mg total) by mouth every 6 (six) hours as needed for flatulence. 05/02/15   Quince Santana Neva Seat, PA-C   BP 103/69 mmHg  Pulse 66  Temp(Src) 98.3 F (36.8 C) (Oral)  Resp 16  Ht  (1.549 m)  Wt 78.019 kg  BMI 32.52 kg/m2  SpO2 100%  LMP 04/28/2015 Physical Exam  Constitutional: She appears well-developed and well-nourished. No distress.  HENT:  Head: Normocephalic and atraumatic.  Eyes: Pupils are equal, round, and reactive to light.  Neck: Normal range of motion. Neck supple.  Cardiovascular: Normal rate and regular rhythm.   Pulmonary/Chest: Effort normal.  Abdominal: Soft. Bowel sounds are normal. She exhibits distension. There is tenderness in the suprapubic area. There is guarding. There is no rigidity, no rebound, no CVA tenderness, no tenderness at McBurney's point and negative Murphy's sign.  Genitourinary: Cervix exhibits motion tenderness. Cervix exhibits no discharge and no friability. Right adnexum displays no mass, no tenderness and  no fullness. Left adnexum displays tenderness. Left adnexum displays no mass and no fullness. No vaginal discharge found.  Neurological: She is alert.  Skin: Skin is warm and dry.  Nursing note and vitals reviewed.   ED Course  Procedures (including critical care time) Labs Review Labs Reviewed  WET PREP, GENITAL - Abnormal; Notable for the following:    WBC, Wet Prep HPF POC FEW (*)    All other components within normal limits  CBC WITH DIFFERENTIAL/PLATELET -  Abnormal; Notable for the following:    WBC 11.9 (*)    Neutro Abs 8.4 (*)    All other components within normal limits  BASIC METABOLIC PANEL - Abnormal; Notable for the following:    Potassium 3.4 (*)    Glucose, Bld 149 (*)    Calcium 8.8 (*)    All other components within normal limits  URINALYSIS, ROUTINE W REFLEX MICROSCOPIC (NOT AT [facility]) - Abnormal; Notable for the following:    Hgb urine dipstick LARGE (*)    All other components within normal limits  URINE MICROSCOPIC-ADD ON - Abnormal; Notable for the following:    Squamous Epithelial / LPF 0-5 (*)    Bacteria, UA RARE (*)    All other components within normal limits  GC/CHLAMYDIA PROBE AMP ([place]) NOT AT [facility]    Imaging Review Ct Abdomen Pelvis W Contrast  05/02/2015  CLINICAL DATA:  Right lower quadrant abdominal pain and nausea today. EXAM: CT ABDOMEN AND PELVIS WITH CONTRAST TECHNIQUE: Multidetector CT imaging of the abdomen and pelvis was performed using the standard protocol following bolus administration of intravenous contrast. CONTRAST:  50mL OMNIPAQUE IOHEXOL 300 MG/ML SOLN, 50mL OMNIPAQUE IOHEXOL 300 MG/ML SOLN, 50mL OMNIPAQUE IOHEXOL 300 MG/ML SOLN COMPARISON:  None. FINDINGS: Lower chest: Small amount of linear atelectasis or scarring at both lung bases. Hepatobiliary: No masses or other significant abnormality. Pancreas: No mass, inflammatory changes, or other significant abnormality. Spleen: Within normal limits in size and appearance. Adrenals/Urinary Tract: No masses identified. No evidence of hydronephrosis. Stomach/Bowel: No evidence of obstruction, inflammatory process, or abnormal fluid collections. Normal appearing appendix. Vascular/Lymphatic: No arterial calcifications or enlarged lymph nodes. Reproductive: Exophytic mass arising from the superior aspect of the right ovary, measuring 4.0 x 3.4 cm on axial image number 69 and 5.5 cm in length on coronal image number 56. This has lower density component  anteriorly and a medium density component posteriorly with a suggestion of a fluid/ debris level. Normal appearing uterus and left ovary. Other: Minimal free peritoneal fluid in the pelvis, within normal limits of physiological fluid. Musculoskeletal: Lower thoracic spine degenerative changes and mild lumbar spine degenerative changes. IMPRESSION: 5.5 x 4.0 x 3.4 cm probable hemorrhagic right ovarian cyst. This could also represent an endometrioma. A neoplasm is significantly less likely. This could be followed with a transabdominal and transvaginal pelvic ultrasound in 2 months. Electronically Signed   By: Beckie Salts M.D.   On: 05/02/2015 21:52   I have personally reviewed and evaluated these images and lab results as part of my medical decision-making.   EKG Interpretation None      MDM   Final diagnoses:  Hemorrhagic ovarian cyst  Generalized abdominal pain  Bloating    Discussed case with Dr. Adela Lank who recommended CT scan to start of abd/pelv for further evaluation of her abdominal pain  Urinalysis shows large hemoglobin from clean catch she is on her menstrual cycle. CBC shows elevated WBC at 11.9.  BMP is unremarkable. Wet prep  shows no significant findings.  CT abd/pelv shows right hemorrhagic ovarian cyst rupture. Discussed + or minus ultrasound of ovary and uterus. On re-examination the patient is diffusely tender and bloated. I have very low suspicion of ovarian torsion. Will have her follow-up with [facility].  Given azithro and rocephin in the ED and rx for doxycycline BID for 14 days for PID due to Cervical Motion tenderness on exam.  Filed Vitals:   05/02/15 1921  BP: 103/69  Pulse: 66  Temp: 98.3 F (36.8 C)  Resp: [address]16      Kassadie Pancake, PA-C 05/02/15 2214  Melene Planan Floyd, DO 05/02/15 2321

## 2015-05-05 LAB — GC/CHLAMYDIA PROBE AMP (~~LOC~~) NOT AT ARMC
CHLAMYDIA, DNA PROBE: NEGATIVE
NEISSERIA GONORRHEA: NEGATIVE

## 2015-05-19 ENCOUNTER — Encounter (HOSPITAL_BASED_OUTPATIENT_CLINIC_OR_DEPARTMENT_OTHER): Payer: Self-pay | Admitting: *Deleted

## 2015-05-19 ENCOUNTER — Emergency Department (HOSPITAL_BASED_OUTPATIENT_CLINIC_OR_DEPARTMENT_OTHER)
Admission: EM | Admit: 2015-05-19 | Discharge: 2015-05-19 | Disposition: A | Discharge status: Home / Self Care | Payer: Managed Care, Other (non HMO) | Attending: Emergency Medicine | Admitting: Emergency Medicine

## 2015-05-19 ENCOUNTER — Emergency Department (HOSPITAL_BASED_OUTPATIENT_CLINIC_OR_DEPARTMENT_OTHER): Payer: Managed Care, Other (non HMO)

## 2015-05-19 DIAGNOSIS — Z79899 Other long term (current) drug therapy: Secondary | ICD-10-CM | POA: Diagnosis not present

## 2015-05-19 DIAGNOSIS — E669 Obesity, unspecified: Secondary | ICD-10-CM | POA: Insufficient documentation

## 2015-05-19 DIAGNOSIS — Z8639 Personal history of other endocrine, nutritional and metabolic disease: Secondary | ICD-10-CM | POA: Diagnosis not present

## 2015-05-19 DIAGNOSIS — Z3202 Encounter for pregnancy test, result negative: Secondary | ICD-10-CM | POA: Diagnosis not present

## 2015-05-19 DIAGNOSIS — Z8669 Personal history of other diseases of the nervous system and sense organs: Secondary | ICD-10-CM | POA: Diagnosis not present

## 2015-05-19 DIAGNOSIS — N83201 Unspecified ovarian cyst, right side: Secondary | ICD-10-CM

## 2015-05-19 DIAGNOSIS — R1031 Right lower quadrant pain: Secondary | ICD-10-CM

## 2015-05-19 DIAGNOSIS — Z792 Long term (current) use of antibiotics: Secondary | ICD-10-CM | POA: Insufficient documentation

## 2015-05-19 DIAGNOSIS — N83209 Unspecified ovarian cyst, unspecified side: Secondary | ICD-10-CM

## 2015-05-19 DIAGNOSIS — R102 Pelvic and perineal pain: Secondary | ICD-10-CM | POA: Diagnosis present

## 2015-05-19 LAB — URINALYSIS, ROUTINE W REFLEX MICROSCOPIC
Bilirubin Urine: NEGATIVE
Glucose, UA: NEGATIVE mg/dL
Hgb urine dipstick: NEGATIVE
Ketones, ur: NEGATIVE mg/dL
Leukocytes, UA: NEGATIVE
Nitrite: NEGATIVE
PH: 5 (ref 5.0–8.0)
Protein, ur: NEGATIVE mg/dL
Specific Gravity, Urine: 1.022 (ref 1.005–1.030)

## 2015-05-19 LAB — CBC WITH DIFFERENTIAL/PLATELET
Basophils Absolute: 0 10*3/uL (ref 0.0–0.1)
Basophils Relative: 0 %
EOS PCT: 2 %
Eosinophils Absolute: 0.2 10*3/uL (ref 0.0–0.7)
HCT: 41.7 % (ref 36.0–46.0)
Hemoglobin: 13.8 g/dL (ref 12.0–15.0)
LYMPHS ABS: 3.1 10*3/uL (ref 0.7–4.0)
LYMPHS PCT: 28 %
MCH: 28.6 pg (ref 26.0–34.0)
MCHC: 33.1 g/dL (ref 30.0–36.0)
MCV: 86.5 fL (ref 78.0–100.0)
MONO ABS: 0.6 10*3/uL (ref 0.1–1.0)
Monocytes Relative: 5 %
Neutro Abs: 7.1 10*3/uL (ref 1.7–7.7)
Neutrophils Relative %: 65 %
PLATELETS: 416 10*3/uL — AB (ref 150–400)
RBC: 4.82 MIL/uL (ref 3.87–5.11)
RDW: 12.8 % (ref 11.5–15.5)
WBC: 11 10*3/uL — ABNORMAL HIGH (ref 4.0–10.5)

## 2015-05-19 LAB — BASIC METABOLIC PANEL
Anion gap: 9 (ref 5–15)
BUN: 15 mg/dL (ref 6–20)
CHLORIDE: 105 mmol/L (ref 101–111)
CO2: 25 mmol/L (ref 22–32)
Calcium: 9.2 mg/dL (ref 8.9–10.3)
Creatinine, Ser: 0.77 mg/dL (ref 0.44–1.00)
GFR calc Af Amer: >60 mL/min (ref >60)
GLUCOSE: 125 mg/dL — AB (ref 65–99)
POTASSIUM: 3.6 mmol/L (ref 3.5–5.1)
Sodium: 139 mmol/L (ref 135–145)

## 2015-05-19 LAB — PREGNANCY, URINE: Preg Test, Ur: NEGATIVE

## 2015-05-19 MED ORDER — OXYCODONE-ACETAMINOPHEN 5-325 MG PO TABS: 1.0000 | ORAL_TABLET | Freq: Four times a day (QID) | ORAL | Status: DC | PRN

## 2015-05-19 MED ORDER — IBUPROFEN 600 MG PO TABS
600.0000 mg | ORAL_TABLET | Freq: Three times a day (TID) | ORAL | Status: DC
Start: 2015-05-19 — End: 2017-02-11

## 2015-05-19 MED ORDER — HYDROMORPHONE HCL 1 MG/ML IJ SOLN
0.5000 mg | Freq: Once | INTRAMUSCULAR | Status: AC
  Administered 2015-05-19: 0.5 mg via INTRAVENOUS
  Filled 2015-05-19: qty 1

## 2015-05-19 MED ORDER — HYDROMORPHONE HCL 1 MG/ML IJ SOLN
1.0000 mg | Freq: Once | INTRAMUSCULAR | Status: AC
  Administered 2015-05-19: 1 mg via INTRAVENOUS
  Filled 2015-05-19: qty 1

## 2015-05-19 MED ORDER — ONDANSETRON HCL 4 MG/2ML IJ SOLN
4.0000 mg | Freq: Once | INTRAMUSCULAR | Status: AC
  Administered 2015-05-19: 4 mg via INTRAVENOUS
  Filled 2015-05-19: qty 2

## 2015-05-19 MED ORDER — SODIUM CHLORIDE 0.9 % IV BOLUS (SEPSIS)
1000.0000 mL | Freq: Once | INTRAVENOUS | Status: AC
  Administered 2015-05-19: 1000 mL via INTRAVENOUS

## 2015-05-19 NOTE — Discharge Instructions (Signed)
Ovarian Cyst An ovarian cyst is a fluid-filled sac that forms on an ovary. The ovaries are small organs that produce eggs in women. Various types of cysts can form on the ovaries. Most are not cancerous. Many do not cause problems, and they often go away on their own. Some may cause symptoms and require treatment. Common types of ovarian cysts include:  Functional cysts--These cysts may occur every month during the menstrual cycle. This is normal. The cysts usually go away with the next menstrual cycle if the woman does not get pregnant. Usually, there are no symptoms with a functional cyst.  Endometrioma cysts--These cysts form from the tissue that lines the uterus. They are also called "chocolate cysts" because they become filled with blood that turns brown. This type of cyst can cause pain in the lower abdomen during intercourse and with your menstrual period.  Cystadenoma cysts--This type develops from the cells on the outside of the ovary. These cysts can get very big and cause lower abdomen pain and pain with intercourse. This type of cyst can twist on itself, cut off its blood supply, and cause severe pain. It can also easily rupture and cause a lot of pain.  Dermoid cysts--This type of cyst is sometimes found in both ovaries. These cysts may contain different kinds of body tissue, such as skin, teeth, hair, or cartilage. They usually do not cause symptoms unless they get very big.  Theca lutein cysts--These cysts occur when too much of a certain hormone (human chorionic gonadotropin) is produced and overstimulates the ovaries to produce an egg. This is most common after procedures used to assist with the conception of a baby (in vitro fertilization). CAUSES   Fertility drugs can cause a condition in which multiple large cysts are formed on the ovaries. This is called ovarian hyperstimulation syndrome.  A condition called polycystic ovary syndrome can cause hormonal imbalances that can lead to  nonfunctional ovarian cysts. SIGNS AND SYMPTOMS  Many ovarian cysts do not cause symptoms. If symptoms are present, they may include:  Pelvic pain or pressure.  Pain in the lower abdomen.  Pain during sexual intercourse.  Increasing girth (swelling) of the abdomen.  Abnormal menstrual periods.  Increasing pain with menstrual periods.  Stopping having menstrual periods without being pregnant. DIAGNOSIS  These cysts are commonly found during a routine or annual pelvic exam. Tests may be ordered to find out more about the cyst. These tests may include:  Ultrasound.  X-ray of the pelvis.  CT scan.  MRI.  Blood tests. TREATMENT  Many ovarian cysts go away on their own without treatment. Your health care provider may want to check your cyst regularly for 2-3 months to see if it changes. For women in menopause, it is particularly important to monitor a cyst closely because of the higher rate of ovarian cancer in menopausal women. When treatment is needed, it may include any of the following:  A procedure to drain the cyst (aspiration). This may be done using a long needle and ultrasound. It can also be done through a laparoscopic procedure. This involves using a thin, lighted tube with a tiny camera on the end (laparoscope) inserted through a small incision.  Surgery to remove the whole cyst. This may be done using laparoscopic surgery or an open surgery involving a larger incision in the lower abdomen.  Hormone treatment or birth control pills. These methods are sometimes used to help dissolve a cyst. HOME CARE INSTRUCTIONS   Only take over-the-counter   or prescription medicines as directed by your health care provider.  Follow up with your health care provider as directed.  Get regular pelvic exams and Pap tests. SEEK MEDICAL CARE IF:   Your periods are late, irregular, or painful, or they stop.  Your pelvic pain or abdominal pain does not go away.  Your abdomen becomes  larger or swollen.  You have pressure on your bladder or trouble emptying your bladder completely.  You have pain during sexual intercourse.  You have feelings of fullness, pressure, or discomfort in your stomach.  You lose weight for no apparent reason.  You feel generally ill.  You become constipated.  You lose your appetite.  You develop acne.  You have an increase in body and facial hair.  You are gaining weight, without changing your exercise and eating habits.  You think you are pregnant. SEEK IMMEDIATE MEDICAL CARE IF:   You have increasing abdominal pain.  You feel sick to your stomach (nauseous), and you throw up (vomit).  You develop a fever that comes on suddenly.  You have abdominal pain during a bowel movement.  Your menstrual periods become heavier than usual. MAKE SURE YOU:  Understand these instructions.  Will watch your condition.  Will get help right away if you are not doing well or get worse.   This information is not intended to replace advice given to you by your health care provider. Make sure you discuss any questions you have with your health care provider.   Document Released: 05/17/2005 Document Revised: 05/22/2013 Document Reviewed: 01/22/2013 Elsevier Interactive Patient Education 2016 [employer]  

## 2015-05-19 NOTE — ED Notes (Signed)
Patient transported to Ultrasound 

## 2015-05-19 NOTE — ED Notes (Signed)
Patient's husband came to [facility] and stated that the patient can not breath due to the pain.  Upon entering room, patient is awake, alert and holding her lower abdomen.  Encouraged to stop crying since this increased pain, VS taken and wnl.

## 2015-05-19 NOTE — ED Provider Notes (Signed)
CSN: [id number]     Arrival date & time 05/19/15  1006 History   First MD Initiated Contact with Patient 05/19/15 1031     Chief Complaint  Patient presents with  . Pelvic Pain     (Consider location/radiation/quality/duration/timing/severity/associated sxs/prior Treatment) Patient is a 44 y.o. female presenting with abdominal pain. The history is provided by the patient.  Abdominal Pain Pain location: A band across low abdomen from left lower quadrant across to right lower quadrant. Pain quality: aching, sharp, shooting and stabbing   Pain quality: not cramping   Pain radiates to:  Does not radiate Pain severity:  Severe (10/10) Onset quality:  Sudden Duration:  1 hour Timing:  Intermittent Progression:  Worsening Chronicity:  Recurrent Context: not diet changes, not eating, not laxative use, not medication withdrawal, not previous surgeries, not retching, not sick contacts, not suspicious food intake and not trauma   Context comment:  Diagnosis 2 weeks ago with hemorrhagic right ovarian cyst Relieved by:  Nothing Worsened by:  Movement and position changes Ineffective treatments:  None tried Associated symptoms: no chest pain, no chills, no constipation, no diarrhea, no dysuria, no fatigue, no fever, no flatus, no hematemesis, no hematochezia, no hematuria, no melena, no nausea, no shortness of breath, no vaginal bleeding, no vaginal discharge and no vomiting   Risk factors: obesity   Risk factors: has not had multiple surgeries, no NSAID use, not pregnant and no recent hospitalization     Patient is a 44 year old female presents to the ER for evaluation of low abdominal pain, states that 2 weeks ago she had the same pain and was diagnosed with a "burst ovarian cyst."  2 weeks ago she was on her. When she began having the same pain however at that time it had a gradual onset.  She was given pain medication and encouraged to follow up with OB/GYN. She has not been seen by OB/GYN but  her pain gradually improved. She states that she is not having any symptoms this morning and was going about her normal routine when she had the sudden onset of pain that began at approximately 9 AM which is described as a constant ache across her low abdomen rated 10 out of 10.  She states she did take pain medication and had no improvement. She last ate between 8 and 9 AM and drink coffee at 7 AM. Her last bowel movement was this morning and she denies any constipation, diarrhea. She has not had any nausea or vomiting and she denies any blood in her urine or bowel movements. She states she has not had any difficulty or irregularity with her periods other than 2 weeks ago she had heavier than normal flow. She denies any vaginal or urinary symptoms and is not at risk for STDs due to having the same partner for approximately 2 decades.   Past Medical History  Diagnosis Date  . History of thyroid nodule 2011  . 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 No past surgeries     Family History  Problem Relation Age of Onset  . Hyperlipidemia Maternal Grandmother   . Hypertension Maternal Grandmother   . Diabetes Maternal Grandfather   . Psoriasis Maternal Grandfather   . Heart disease Maternal Grandfather   . Heart disease Paternal Grandfather   . Cancer Father     prostate    Social History  Substance Use Topics  . Smoking  status: Never Smoker   . Smokeless tobacco: Never Used  . Alcohol Use: Yes     Comment: social /occasional   OB History    No data available     Review of Systems  Constitutional: Negative.  Negative for fever, chills and fatigue.  HENT: Negative.   Eyes: Negative.   Respiratory: Negative.  Negative for shortness of breath.   Cardiovascular: Negative.  Negative for chest pain.  Gastrointestinal: Positive for abdominal pain. Negative for nausea, vomiting, diarrhea, constipation, blood in stool, melena,  hematochezia, abdominal distention, anal bleeding, rectal pain, flatus and hematemesis.  Endocrine: Negative.   Genitourinary: Positive for pelvic pain. Negative for dysuria, urgency, frequency, hematuria, flank pain, decreased urine volume, vaginal bleeding, vaginal discharge, difficulty urinating, genital sores, vaginal pain, menstrual problem and dyspareunia.  Musculoskeletal: Negative.   Skin: Negative.   Allergic/Immunologic: Negative.   Neurological: Negative.   Hematological: Negative.   Psychiatric/Behavioral: Negative.   All other systems reviewed and are negative.     Allergies  Asa  Home Medications   Prior to Admission medications   Medication Sig Start Date End Date Taking? Authorizing Provider  doxycycline (VIBRAMYCIN) 100 MG capsule Take 1 capsule (100 mg total) by mouth 2 (two) times daily. 05/02/15   Marlon Pel, PA-C  HYDROcodone-acetaminophen (NORCO/VICODIN) 5-325 MG tablet Take 1-2 tablets by mouth every 4 (four) hours as needed. 05/02/15   Tiffany Neva Seat, PA-C  ibuprofen (ADVIL,MOTRIN) 600 MG tablet Take 1 tablet (600 mg total) by mouth 3 (three) times daily. 05/19/15   Danelle Berry, PA-C  ondansetron (ZOFRAN) 4 MG tablet Take 1 tablet (4 mg total) by mouth every 6 (six) hours. 05/02/15   Marlon Pel, PA-C  oxyCODONE-acetaminophen (PERCOCET/ROXICET) 5-325 MG tablet Take 1-2 tablets by mouth every 6 (six) hours as needed for severe pain. 05/19/15   Danelle Berry, PA-C  simethicone (GAS-X) 80 MG chewable tablet Chew 1 tablet (80 mg total) by mouth every 6 (six) hours as needed for flatulence. 05/02/15   Tiffany Neva Seat, PA-C   BP 116/76 mmHg  Pulse 80  Temp(Src) 98.3 F (36.8 C) (Oral)  Resp 18  Ht 5\' 1"  (1.549 m)  Wt 77.111 kg  BMI 32.14 kg/m2  SpO2 96%  LMP 04/28/2015 Physical Exam  Constitutional: She is oriented to person, place, and time. She appears well-developed and well-nourished. No distress.  HENT:  Head: Normocephalic and atraumatic.  Nose: Nose  normal.  Mouth/Throat: Oropharynx is clear and moist. No oropharyngeal exudate.  Eyes: Conjunctivae and EOM are normal. Pupils are equal, round, and reactive to light. Right eye exhibits no discharge. Left eye exhibits no discharge. No scleral icterus.  Neck: Normal range of motion. No JVD present. No tracheal deviation present. No thyromegaly present.  Cardiovascular: Normal rate, regular rhythm, normal heart sounds and intact distal pulses.  Exam reveals no gallop and no friction rub.   No murmur heard. Pulmonary/Chest: Effort normal and breath sounds normal. No respiratory distress. She has no wheezes. She has no rales. She exhibits no tenderness.  Abdominal: Soft. Bowel sounds are normal. She exhibits no distension and no mass. There is tenderness. There is guarding. There is no rebound.   tenderness to palpation with guarding, to entire abdomen but sparing the epigastrium  No CVA tenderness  Genitourinary: No vaginal discharge found.  ttp to bilateral adnexa and cervix with bimanual exam, no masses palpated  Musculoskeletal: Normal range of motion. She exhibits no edema or tenderness.  Lymphadenopathy:    She has no cervical  adenopathy.  Neurological: She is alert and oriented to person, place, and time. She has normal reflexes. No cranial nerve deficit. She exhibits normal muscle tone. Coordination normal.  Skin: Skin is warm and dry. No rash noted. She is not diaphoretic. No erythema. No pallor.  Psychiatric: She has a normal mood and affect. Her behavior is normal. Judgment and thought content normal.  Nursing note and vitals reviewed.   ED Course  Procedures (including critical care time) Labs Review Labs Reviewed  BASIC METABOLIC PANEL - Abnormal; Notable for the following:    Glucose, Bld 125 (*)    All other components within normal limits  CBC WITH DIFFERENTIAL/PLATELET - Abnormal; Notable for the following:    WBC 11.0 (*)    Platelets 416 (*)    All other components  within normal limits  URINALYSIS, ROUTINE W REFLEX MICROSCOPIC (NOT AT [facility])  PREGNANCY, URINE    Imaging Review US Transvaginal Non-ob  05/19/2015  CLINICAL DATA:  Right ovarian cyst demonstrated on abdominal and pelvic CT scan of May 02, 2015 EXAM: TRANSABDOMINAL AND TRANSVAGINAL ULTRASOUND OF PELVIS DOPPLER ULTRASOUND OF OVARIES TECHNIQUE: Both transabdominal and transvaginal ultrasound examinations of the pelvis were performed. Transabdominal technique was performed for global imaging of the pelvis including uterus, ovaries, adnexal regions, and pelvic cul-de-sac. It was necessary to proceed with endovaginal exam following the transabdominal exam to visualize the ovaries and adnexal regions as well as endometrium. Color and duplex Doppler ultrasound was utilized to evaluate blood flow to the ovaries. COMPARISON:  Abdominal and pelvic CT scan of May 02, 2015 FINDINGS: The study is limited due to significant guarding during the transabdominal portion of the study. There is also considerable gas-filled bowel limiting the acoustic windows. Uterus Measurements: 9.4 x 5.2 x 5.8 cm. No fibroids or other mass visualized. Endometrium Thickness: 11.7 mm. No abnormal endometrial fluid collections nor endometrial masses are observed. Right ovary The right ovary could not be discretely demonstrated. In the adnexal region there is a hypoechoic structure measuring 3.3 x 2.1 x 3.3 mm. Left ovary Measurements: 3.3 x 2.5 x 3.1 cm. There is a hypoechoic follow-up: Focus with internal echoes measuring 1.9 x 2 x 2.3 cm. There is a hypoechoic cystic structure with internal echoes measuring 1.8 x 1.1 x 1.7 cm. Pulsed Doppler evaluation of the left ovary demonstrates normal low-resistance arterial and venous waveforms. Other findings There is a small amount of free pelvic fluid. IMPRESSION: 1. The right ovary could not be discretely demonstrated. In the right adnexal region there is a complex appearing hypoechoic  structure which has decreased in size since the previous study and now measures 3.3 cm in greatest dimension. A hemorrhagic cyst remains the leading differential entity. 2. The left ovary is demonstrated and exhibits normal echotexture but there are 2 exophytic hypoechoic structures present the largest of which measures 2.3 cm in greatest dimension. One appears at least partially cystic while the other is solid in appearance. 3. The uterus and endometrium are normal in appearance. There is small amount of free pelvic fluid. 4. Given the patient's tenderness, acute or subacute inflammatory change may be present. Correlation with the patient's current clinical examination is needed. If the patient improving clinically, a follow-up ultrasound in 6-12 weeks is recommended. If the patient's clinical condition is worsening, MRI of the pelvis now would be a useful next imaging step. Electronically Signed   By: David  [place] M.D.   On: 05/19/2015 13:15   US Pelvis Complete  05/19/2015  CLINICAL  DATA:  Right ovarian cyst demonstrated on abdominal and pelvic CT scan of May 02, 2015 EXAM: TRANSABDOMINAL AND TRANSVAGINAL ULTRASOUND OF PELVIS DOPPLER ULTRASOUND OF OVARIES TECHNIQUE: Both transabdominal and transvaginal ultrasound examinations of the pelvis were performed. Transabdominal technique was performed for global imaging of the pelvis including uterus, ovaries, adnexal regions, and pelvic cul-de-sac. It was necessary to proceed with endovaginal exam following the transabdominal exam to visualize the ovaries and adnexal regions as well as endometrium. Color and duplex Doppler ultrasound was utilized to evaluate blood flow to the ovaries. COMPARISON:  Abdominal and pelvic CT scan of May 02, 2015 FINDINGS: The study is limited due to significant guarding during the transabdominal portion of the study. There is also considerable gas-filled bowel limiting the acoustic windows. Uterus Measurements: 9.4 x 5.2 x 5.8  cm. No fibroids or other mass visualized. Endometrium Thickness: 11.7 mm. No abnormal endometrial fluid collections nor endometrial masses are observed. Right ovary The right ovary could not be discretely demonstrated. In the adnexal region there is a hypoechoic structure measuring 3.3 x 2.1 x 3.3 mm. Left ovary Measurements: 3.3 x 2.5 x 3.1 cm. There is a hypoechoic follow-up: Focus with internal echoes measuring 1.9 x 2 x 2.3 cm. There is a hypoechoic cystic structure with internal echoes measuring 1.8 x 1.1 x 1.7 cm. Pulsed Doppler evaluation of the left ovary demonstrates normal low-resistance arterial and venous waveforms. Other findings There is a small amount of free pelvic fluid. IMPRESSION: 1. The right ovary could not be discretely demonstrated. In the right adnexal region there is a complex appearing hypoechoic structure which has decreased in size since the previous study and now measures 3.3 cm in greatest dimension. A hemorrhagic cyst remains the leading differential entity. 2. The left ovary is demonstrated and exhibits normal echotexture but there are 2 exophytic hypoechoic structures present the largest of which measures 2.3 cm in greatest dimension. One appears at least partially cystic while the other is solid in appearance. 3. The uterus and endometrium are normal in appearance. There is small amount of free pelvic fluid. 4. Given the patient's tenderness, acute or subacute inflammatory change may be present. Correlation with the patient's current clinical examination is needed. If the patient improving clinically, a follow-up ultrasound in 6-12 weeks is recommended. If the patient's clinical condition is worsening, MRI of the pelvis now would be a useful next imaging step. Electronically Signed   By: David  [place] M.D.   On: 05/19/2015 13:15   [place] Art/ven Flow Abd Pelv Doppler  05/19/2015  CLINICAL DATA:  Right ovarian cyst demonstrated on abdominal and pelvic CT scan of May 02, 2015  EXAM: TRANSABDOMINAL AND TRANSVAGINAL ULTRASOUND OF PELVIS DOPPLER ULTRASOUND OF OVARIES TECHNIQUE: Both transabdominal and transvaginal ultrasound examinations of the pelvis were performed. Transabdominal technique was performed for global imaging of the pelvis including uterus, ovaries, adnexal regions, and pelvic cul-de-sac. It was necessary to proceed with endovaginal exam following the transabdominal exam to visualize the ovaries and adnexal regions as well as endometrium. Color and duplex Doppler ultrasound was utilized to evaluate blood flow to the ovaries. COMPARISON:  Abdominal and pelvic CT scan of May 02, 2015 FINDINGS: The study is limited due to significant guarding during the transabdominal portion of the study. There is also considerable gas-filled bowel limiting the acoustic windows. Uterus Measurements: 9.4 x 5.2 x 5.8 cm. No fibroids or other mass visualized. Endometrium Thickness: 11.7 mm. No abnormal endometrial fluid collections nor endometrial masses are observed. Right ovary The  right ovary could not be discretely demonstrated. In the adnexal region there is a hypoechoic structure measuring 3.3 x 2.1 x 3.3 mm. Left ovary Measurements: 3.3 x 2.5 x 3.1 cm. There is a hypoechoic follow-up: Focus with internal echoes measuring 1.9 x 2 x 2.3 cm. There is a hypoechoic cystic structure with internal echoes measuring 1.8 x 1.1 x 1.7 cm. Pulsed Doppler evaluation of the left ovary demonstrates normal low-resistance arterial and venous waveforms. Other findings There is a small amount of free pelvic fluid. IMPRESSION: 1. The right ovary could not be discretely demonstrated. In the right adnexal region there is a complex appearing hypoechoic structure which has decreased in size since the previous study and now measures 3.3 cm in greatest dimension. A hemorrhagic cyst remains the leading differential entity. 2. The left ovary is demonstrated and exhibits normal echotexture but there are 2 exophytic  hypoechoic structures present the largest of which measures 2.3 cm in greatest dimension. One appears at least partially cystic while the other is solid in appearance. 3. The uterus and endometrium are normal in appearance. There is small amount of free pelvic fluid. 4. Given the patient's tenderness, acute or subacute inflammatory change may be present. Correlation with the patient's current clinical examination is needed. If the patient improving clinically, a follow-up ultrasound in 6-12 weeks is recommended. If the patient's clinical condition is worsening, MRI of the pelvis now would be a useful next imaging step. Electronically Signed   By: David  [place] M.D.   On: 05/19/2015 13:15   I have personally reviewed and evaluated these images and lab results as part of my medical decision-making.   EKG Interpretation None      MDM   The patient was sudden onset low abdominal pain  Pelvic ultrasound with arterial flow was ordered to rule out ovarian torsion with recent diagnosis of right hemorrhagic ovarian cyst  The study was somewhat limited by the patient's guarding. The right ovarian cystic structure has decreased in size from the prior study 2 weeks ago. The left ovary also has 2 cysts present. Uterus and endometrium appeared normal. There is free fluid in the pelvis.  The patient's case was reviewed with Dr. Judd Lienelo who has seen and evaluated the patient and is in agreement with workup and plan to consult OB/GYN  On-call OB/GYN was consulted for assistance with further workup and management, as the ultrasound studies did not definitively rule out any torsion however clinically the patient has been ambulatory with significant improvement of her pain. She has not had any vomiting while in the ER and her vital signs have been stable. Urinalysis was negative, negative pregnancy test, labs were unremarkable except for a mild leukocytosis of 11.0.  I spoke with Dr. Jolayne Pantheronstant from OBGYN - advised  ovarian cyst likely ruptured causing sudden onset pain and decreased size of cyst - she advised NSAID, narcotic pain meds, and heating pad and pain will gradually decrease over 2 weeks. She stated the patient should call the clinic in the morning to establish follow-up.  She also advised that no further workup was indicated at this time.  Results of the ultrasound and recommendations OB/GYN were discussed with the patient and with her spouse.  They verbalize understanding about instructions and recovery time. They also agreed to call OB/GYN for close follow-up.  Patient was able to tolerate PO's prior to discharge. Vital signs are stable and patient was ambulatory when discharged.  Final diagnoses:  Right ovarian cyst  Hemorrhagic cyst  of ovary  RLQ abdominal pain           Danelle Berry, PA-C 05/20/15 2345  Geoffery Lyons, MD 05/27/15 [phone]

## 2015-05-19 NOTE — ED Notes (Signed)
Pt states she is still uanble to void. Will notify staff asap when she can provide sample.

## 2015-05-19 NOTE — ED Notes (Signed)
Pa chaperoned for bimanual exam. Pt tolerated well.

## 2015-05-19 NOTE — ED Notes (Signed)
Pt reports being seen here "for a burst ovarian cyst" 2 weeks ago. Pt states today she is having the same pain. Pt is holding her right lower abd, denies any vag bleeding or d/c, last bm this am, normal. Denies any n/v.

## 2015-05-19 NOTE — ED Notes (Signed)
Pt states she is unable to void at this time, given specimen cup and instructions for cc urine sample.

## 2015-06-01 DIAGNOSIS — N8110 Cystocele, unspecified: Secondary | ICD-10-CM

## 2015-06-01 HISTORY — DX: Cystocele, unspecified: N81.10

## 2015-06-06 ENCOUNTER — Telehealth: Payer: Self-pay | Admitting: *Deleted

## 2015-06-06 ENCOUNTER — Ambulatory Visit (INDEPENDENT_AMBULATORY_CARE_PROVIDER_SITE_OTHER): Payer: Managed Care, Other (non HMO) | Admitting: Obstetrics & Gynecology

## 2015-06-06 ENCOUNTER — Encounter: Payer: Self-pay | Admitting: Obstetrics & Gynecology

## 2015-06-06 VITALS — BP 151/105 | HR 78 | Temp 98.4°F | Wt 169.6 lb

## 2015-06-06 DIAGNOSIS — R1013 Epigastric pain: Secondary | ICD-10-CM

## 2015-06-06 DIAGNOSIS — N83201 Unspecified ovarian cyst, right side: Secondary | ICD-10-CM

## 2015-06-06 MED ORDER — FAMOTIDINE 40 MG PO TABS: 40.0000 mg | ORAL_TABLET | Freq: Every evening | ORAL | Status: DC

## 2015-06-06 MED ORDER — PANTOPRAZOLE SODIUM 40 MG PO TBEC: 40.0000 mg | DELAYED_RELEASE_TABLET | Freq: Every day | ORAL | Status: DC

## 2015-06-06 MED ORDER — SIMETHICONE 80 MG PO CHEW: 80.0000 mg | CHEWABLE_TABLET | Freq: Four times a day (QID) | ORAL | Status: DC | PRN

## 2015-06-06 NOTE — Telephone Encounter (Signed)
Called patient to notify her of need to schedule follow up ultrasound. Patient stated she needed either early morning or late afternoon appointment. Ultrasound scheduled for Wed 06/11/15 @ 0830. Patient advised to arrived 15 minutes early. Understanding voiced.

## 2015-06-06 NOTE — Patient Instructions (Signed)
Ovarian Cyst An ovarian cyst is a fluid-filled sac that forms on an ovary. The ovaries are small organs that produce eggs in women. Various types of cysts can form on the ovaries. Most are not cancerous. Many do not cause problems, and they often go away on their own. Some may cause symptoms and require treatment. Common types of ovarian cysts include:  Functional cysts--These cysts may occur every month during the menstrual cycle. This is normal. The cysts usually go away with the next menstrual cycle if the woman does not get pregnant. Usually, there are no symptoms with a functional cyst.  Endometrioma cysts--These cysts form from the tissue that lines the uterus. They are also called "chocolate cysts" because they become filled with blood that turns brown. This type of cyst can cause pain in the lower abdomen during intercourse and with your menstrual period.  Cystadenoma cysts--This type develops from the cells on the outside of the ovary. These cysts can get very big and cause lower abdomen pain and pain with intercourse. This type of cyst can twist on itself, cut off its blood supply, and cause severe pain. It can also easily rupture and cause a lot of pain.  Dermoid cysts--This type of cyst is sometimes found in both ovaries. These cysts may contain different kinds of body tissue, such as skin, teeth, hair, or cartilage. They usually do not cause symptoms unless they get very big.  Theca lutein cysts--These cysts occur when too much of a certain hormone (human chorionic gonadotropin) is produced and overstimulates the ovaries to produce an egg. This is most common after procedures used to assist with the conception of a baby (in vitro fertilization). CAUSES   Fertility drugs can cause a condition in which multiple large cysts are formed on the ovaries. This is called ovarian hyperstimulation syndrome.  A condition called polycystic ovary syndrome can cause hormonal imbalances that can lead to  nonfunctional ovarian cysts. SIGNS AND SYMPTOMS  Many ovarian cysts do not cause symptoms. If symptoms are present, they may include:  Pelvic pain or pressure.  Pain in the lower abdomen.  Pain during sexual intercourse.  Increasing girth (swelling) of the abdomen.  Abnormal menstrual periods.  Increasing pain with menstrual periods.  Stopping having menstrual periods without being pregnant. DIAGNOSIS  These cysts are commonly found during a routine or annual pelvic exam. Tests may be ordered to find out more about the cyst. These tests may include:  Ultrasound.  X-ray of the pelvis.  CT scan.  MRI.  Blood tests. TREATMENT  Many ovarian cysts go away on their own without treatment. Your health care provider may want to check your cyst regularly for 2-3 months to see if it changes. For women in menopause, it is particularly important to monitor a cyst closely because of the higher rate of ovarian cancer in menopausal women. When treatment is needed, it may include any of the following:  A procedure to drain the cyst (aspiration). This may be done using a long needle and ultrasound. It can also be done through a laparoscopic procedure. This involves using a thin, lighted tube with a tiny camera on the end (laparoscope) inserted through a small incision.  Surgery to remove the whole cyst. This may be done using laparoscopic surgery or an open surgery involving a larger incision in the lower abdomen.  Hormone treatment or birth control pills. These methods are sometimes used to help dissolve a cyst. HOME CARE INSTRUCTIONS   Only take over-the-counter   or prescription medicines as directed by your health care provider.  Follow up with your health care provider as directed.  Get regular pelvic exams and Pap tests. SEEK MEDICAL CARE IF:   Your periods are late, irregular, or painful, or they stop.  Your pelvic pain or abdominal pain does not go away.  Your abdomen becomes  larger or swollen.  You have pressure on your bladder or trouble emptying your bladder completely.  You have pain during sexual intercourse.  You have feelings of fullness, pressure, or discomfort in your stomach.  You lose weight for no apparent reason.  You feel generally ill.  You become constipated.  You lose your appetite.  You develop acne.  You have an increase in body and facial hair.  You are gaining weight, without changing your exercise and eating habits.  You think you are pregnant. SEEK IMMEDIATE MEDICAL CARE IF:   You have increasing abdominal pain.  You feel sick to your stomach (nauseous), and you throw up (vomit).  You develop a fever that comes on suddenly.  You have abdominal pain during a bowel movement.  Your menstrual periods become heavier than usual. MAKE SURE YOU:  Understand these instructions.  Will watch your condition.  Will get help right away if you are not doing well or get worse.   This information is not intended to replace advice given to you by your health care provider. Make sure you discuss any questions you have with your health care provider.   Document Released: 05/17/2005 Document Revised: 05/22/2013 Document Reviewed: 01/22/2013 Elsevier Interactive Patient Education 2016 [employer]vier Inc.  

## 2015-06-06 NOTE — Progress Notes (Signed)
Patient ID: Maria [name], female   DOB: 03/24/1971, 45 y.o.   MRN: [id number]  Chief Complaint  Patient presents with  . Ovarian Cyst  right hemorrhagic ovarian cyst s/p ED visit  HPI Maria [name] is a 45 y.o. female.  [id number] Patient's last menstrual period was 05/28/2015 (exact date).  she was seen 12/2 and 12/19 with lower abdominal pain from right ovarian hemorrhagic cyst seen on US with partial resolution on f/u. She still has some low abdominal pain but also epigastric bloating and nausea. [facility] [place] [facility]  Past Medical History  Diagnosis Date  . History of thyroid nodule 2011  . 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 No past surgeries      Family History  Problem Relation Age of Onset  . Hyperlipidemia Maternal Grandmother   . Hypertension Maternal Grandmother   . Diabetes Maternal Grandfather   . Psoriasis Maternal Grandfather   . Heart disease Maternal Grandfather   . Heart disease Paternal Grandfather   . Cancer Father     prostate     Social History Social History  Substance Use Topics  . Smoking status: Never Smoker   . Smokeless tobacco: Never Used  . Alcohol Use: Yes     Comment: social /occasional    Allergies  Allergen Reactions  . Asa [Aspirin] Nausea And Vomiting    Current Outpatient Prescriptions  Medication Sig Dispense Refill  . HYDROcodone-acetaminophen (NORCO/VICODIN) 5-325 MG tablet Take 1-2 tablets by mouth every 4 (four) hours as needed. 15 tablet 0  . ibuprofen (ADVIL,MOTRIN) 600 MG tablet Take 1 tablet (600 mg total) by mouth 3 (three) times daily. 60 tablet 0  . simethicone (GAS-X) 80 MG chewable tablet Chew 1 tablet (80 mg total) by mouth every 6 (six) hours as needed for flatulence. 30 tablet 0  . doxycycline (VIBRAMYCIN) 100 MG capsule Take 1 capsule (100 mg total) by mouth 2 (two) times daily. (Patient not taking: Reported on 06/06/2015) 28 capsule 0  .  ondansetron (ZOFRAN) 4 MG tablet Take 1 tablet (4 mg total) by mouth every 6 (six) hours. 12 tablet 0  . oxyCODONE-acetaminophen (PERCOCET/ROXICET) 5-325 MG tablet Take 1-2 tablets by mouth every 6 (six) hours as needed for severe pain. (Patient not taking: Reported on 06/06/2015) 20 tablet 0  . pantoprazole (PROTONIX) 40 MG tablet Take 1 tablet (40 mg total) by mouth daily. 30 tablet 1   No current facility-administered medications for this visit.    Review of Systems Review of Systems  Respiratory: Negative.   Gastrointestinal: Positive for nausea, abdominal pain and abdominal distention. Negative for diarrhea and constipation.  Genitourinary: Positive for pelvic pain. Negative for menstrual problem.    Blood pressure 151/105, pulse 78, temperature 98.4 F (36.9 C), temperature source Oral, weight 169 lb 9.6 oz (76.93 kg), last menstrual period 05/28/2015.  Physical Exam Physical Exam  Constitutional: She is oriented to person, place, and time.  Abdominal: Soft. She exhibits distension. She exhibits no mass. There is no tenderness. There is no guarding.  Genitourinary: Vagina normal. No vaginal discharge found.  Mild CMT no mass   Neurological: She is alert and oriented to person, place, and time.  Skin: Skin is warm and dry.  Psychiatric: She has a normal mood and affect. Her behavior is normal.    Data Reviewed  CLINICAL DATA: Right ovarian cyst demonstrated on abdominal and pelvic CT scan of  May 02, 2015  EXAM: TRANSABDOMINAL AND TRANSVAGINAL ULTRASOUND OF PELVIS  DOPPLER ULTRASOUND OF OVARIES  TECHNIQUE: Both transabdominal and transvaginal ultrasound examinations of the pelvis were performed. Transabdominal technique was performed for global imaging of the pelvis including uterus, ovaries, adnexal regions, and pelvic cul-de-sac.  It was necessary to proceed with endovaginal exam following the transabdominal exam to visualize the ovaries and adnexal regions  as well as endometrium. Color and duplex Doppler ultrasound was utilized to evaluate blood flow to the ovaries.  COMPARISON: Abdominal and pelvic CT scan of May 02, 2015  FINDINGS: The study is limited due to significant guarding during the transabdominal portion of the study. There is also considerable gas-filled bowel limiting the acoustic windows.  Uterus  Measurements: 9.4 x 5.2 x 5.8 cm. No fibroids or other mass visualized.  Endometrium  Thickness: 11.7 mm. No abnormal endometrial fluid collections nor endometrial masses are observed.  Right ovary  The right ovary could not be discretely demonstrated. In the adnexal region there is a hypoechoic structure measuring 3.3 x 2.1 x 3.3 mm.  Left ovary  Measurements: 3.3 x 2.5 x 3.1 cm. There is a hypoechoic follow-up: Focus with internal echoes measuring 1.9 x 2 x 2.3 cm. There is a hypoechoic cystic structure with internal echoes measuring 1.8 x 1.1 x 1.7 cm.  Pulsed Doppler evaluation of the left ovary demonstrates normal low-resistance arterial and venous waveforms.  Other findings  There is a small amount of free pelvic fluid.  IMPRESSION: 1. The right ovary could not be discretely demonstrated. In the right adnexal region there is a complex appearing hypoechoic structure which has decreased in size since the previous study and now measures 3.3 cm in greatest dimension. A hemorrhagic cyst remains the leading differential entity. 2. The left ovary is demonstrated and exhibits normal echotexture but there are 2 exophytic hypoechoic structures present the largest of which measures 2.3 cm in greatest dimension. One appears at least partially cystic while the other is solid in appearance. 3. The uterus and endometrium are normal in appearance. There is small amount of free pelvic fluid. 4. Given the patient's tenderness, acute or subacute inflammatory change may be present. Correlation with the  patient's current clinical examination is needed. If the patient improving clinically, a follow-up ultrasound in 6-12 weeks is recommended. If the patient's clinical condition is worsening, MRI of the pelvis now would be a useful next imaging step.   Electronically Signed  By: David Swaziland M.D.  On: 05/19/2015 13:15 Assessment    Expect resolving hemorrhagic ovarian cyst Epigastric pain and nausea possible gastritis or PUD     Plan    F/U US for resolution of cyst in 4 weeks F/U with PCP for epigastric pain in 1 week    Pepcid 40 mg daily Simethicone prn   Cecily Lawhorne 06/06/2015, 10:51 AM

## 2015-06-11 ENCOUNTER — Ambulatory Visit (HOSPITAL_COMMUNITY)
Admission: RE | Admit: 2015-06-11 | Discharge: 2015-06-11 | Disposition: A | Discharge status: Home / Self Care | Payer: Managed Care, Other (non HMO) | Source: Ambulatory Visit | Attending: Obstetrics & Gynecology | Admitting: Obstetrics & Gynecology

## 2015-06-11 ENCOUNTER — Telehealth: Payer: Self-pay

## 2015-06-11 DIAGNOSIS — N83201 Unspecified ovarian cyst, right side: Secondary | ICD-10-CM | POA: Diagnosis not present

## 2015-06-11 NOTE — Telephone Encounter (Signed)
Message received from voicemail pt is requesting her ultrasound results.

## 2015-06-12 NOTE — Telephone Encounter (Addendum)
Patient called to see if her results from the ultrasound are ready. Will have Dr. Debroah LoopArnold review and call patient with results.

## 2015-06-20 ENCOUNTER — Telehealth: Payer: Self-pay | Admitting: *Deleted

## 2015-06-20 NOTE — Telephone Encounter (Signed)
Called patient and informed her of Dr. Brayton Layman recommendations based on her ultrasound. She stated that she doesn't want to see [place] and has made another appointment. She had the ultrasound on 06/11/15 and today is 06/20/15 and he is just now letting her know. She also stated that she has filed a complaint. Patient then disconnected call.

## 2015-07-11 ENCOUNTER — Ambulatory Visit: Payer: Managed Care, Other (non HMO) | Admitting: Obstetrics & Gynecology

## 2015-07-23 HISTORY — PX: OOPHORECTOMY: SHX6387

## 2015-07-24 ENCOUNTER — Other Ambulatory Visit: Payer: Self-pay | Admitting: Obstetrics and Gynecology

## 2015-07-31 ENCOUNTER — Telehealth: Payer: Self-pay | Admitting: *Deleted

## 2015-07-31 NOTE — Telephone Encounter (Signed)
Received a voice message today from [facility] pharmacist Peggye Ley and that she had a prescription  given to her early February. States he has received a request from her pharmacy they request a 90 day supply be ordered so he is calling to see if he can get that order.  I called pharmacy to clarify name of medicine and he states it was for famotidine , but  in the meantime he has spoken with the patient and she states she does not need the medicine. So he states to disregard this request.

## 2015-08-02 ENCOUNTER — Other Ambulatory Visit: Payer: Self-pay | Admitting: Obstetrics & Gynecology

## 2015-08-04 ENCOUNTER — Other Ambulatory Visit: Payer: Self-pay | Admitting: Obstetrics & Gynecology

## 2015-08-06 ENCOUNTER — Other Ambulatory Visit: Payer: Self-pay

## 2015-08-06 DIAGNOSIS — R1013 Epigastric pain: Secondary | ICD-10-CM

## 2015-08-06 MED ORDER — PANTOPRAZOLE SODIUM 40 MG PO TBEC: 40.0000 mg | DELAYED_RELEASE_TABLET | Freq: Every day | ORAL | Status: DC

## 2015-08-06 MED ORDER — FAMOTIDINE 40 MG PO TABS: 40.0000 mg | ORAL_TABLET | Freq: Every evening | ORAL | Status: DC

## 2015-10-06 ENCOUNTER — Encounter: Payer: Self-pay | Admitting: Family

## 2015-10-06 ENCOUNTER — Ambulatory Visit (INDEPENDENT_AMBULATORY_CARE_PROVIDER_SITE_OTHER): Payer: Managed Care, Other (non HMO) | Admitting: Family

## 2015-10-06 DIAGNOSIS — Z0000 Encounter for general adult medical examination without abnormal findings: Secondary | ICD-10-CM | POA: Diagnosis not present

## 2015-10-06 MED ORDER — AMLODIPINE BESYLATE 5 MG PO TABS: 5.0000 mg | ORAL_TABLET | Freq: Every day | ORAL | Status: DC

## 2015-10-06 NOTE — Assessment & Plan Note (Signed)
Obtain routine lab work. Advise pt to keep up the good work with diet/exercise/weight loss. Refer for EKG.

## 2015-10-06 NOTE — Patient Instructions (Signed)
Keep up the good work with healthy diet, exercise and weight loss. Begin amlodipine for your blood pressure.

## 2015-10-06 NOTE — Progress Notes (Signed)
Subjective:    Patient ID: Maria [name], female    DOB: January 31, 1971, 45 y.o.   MRN: [id number]  HPI   Maria [name] is a 45 yr old female who presents today for cpx.  Immunizations: Tdap up to date Diet: healthy Exercise: walking Pap Smear: will have hysterectomy 11/10/15 per GYN. Reports right ovarian cyst rupture x 2, had oophorectomy and tube removal. Was told that she is filled with adhesions from endometriosis.  Referred her to Dr. Ashley Royalty for a robotic hysterectomy.  Was placed on norethindrone and had excessive bleeding.  Reports stage 2 vaginal prolapse.   Mammogram: 12/15 Dental: up to date Vision: up to date. Monitoring her pressures for glaucoma  Elevated blood pressure- She stopped hctz for a while. She has been back on for 2 weeks.  Reports that she did [employer] healthy weight loss.  Was walking 3-5 miles a day.  She continues to walk some.   BP Readings from Last 3 Encounters:  10/06/15 150/104  06/06/15 151/105  05/19/15 116/76   Wt Readings from Last 3 Encounters:  10/06/15 173 lb 3.2 oz (78.563 kg)  06/06/15 169 lb 9.6 oz (76.93 kg)  05/19/15 170 lb (77.111 kg)     Review of Systems  Constitutional: Negative for unexpected weight change.  HENT: Negative for hearing loss and rhinorrhea.   Eyes: Negative for visual disturbance.  Respiratory: Negative for cough.   Cardiovascular: Negative for leg swelling.  Gastrointestinal: Negative for diarrhea, constipation and blood in stool.  Genitourinary: Negative for dysuria.       Mild frequency due to hctz  Musculoskeletal: Negative for myalgias and arthralgias.  Neurological:       Some HAs when bp up  Hematological: Negative for adenopathy.  Psychiatric/Behavioral:       Denies depression/anxiety   Past Medical History  Diagnosis Date  . History of thyroid nodule 2011  . 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 Vaginal prolapse 2017     Social History   Social History  . Marital Status: Married   Spouse Name: N/A  . Number of Children: 2  . Years of Education: N/A   Occupational History  . Not on file.   Social History Main Topics  . Smoking status: Never Smoker   . Smokeless tobacco: Never Used  . Alcohol Use: Yes     Comment: social /occasional  . Drug Use: Not on file  . Sexual Activity: Not on file   Other Topics Concern  . Not on file   Social History Narrative   Regular exercise:  Walks once a week   Caffeine use:  1-2 cups coffee daily   She works from home for [employer]   Married   2 children   Enjoys crafting/crocheting/[employer]    Past Surgical History  Procedure Laterality Date  . No past surgeries    . Oophorectomy Right 07/23/15    Dr Huntley Dec    Family History  Problem Relation Age of Onset  . Hyperlipidemia Maternal Grandmother   . Hypertension Maternal Grandmother   . Diabetes Maternal Grandfather   . Psoriasis Maternal Grandfather   . Heart disease Maternal Grandfather   . Heart disease Paternal Grandfather   . Cancer Father     prostate     Allergies  Allergen Reactions  . Oxycodone-Acetaminophen Anaphylaxis  . Asa [Aspirin] Nausea And Vomiting    Current Outpatient Prescriptions on File Prior to Visit  Medication Sig Dispense Refill  .  ibuprofen (ADVIL,MOTRIN) 600 MG tablet Take 1 tablet (600 mg total) by mouth 3 (three) times daily. (Patient not taking: Reported on 10/06/2015) 60 tablet 0   No current facility-administered medications on file prior to visit.    BP 150/104 mmHg  Pulse 78  Temp(Src) 98.6 F (37 C) (Oral)  Resp 18  Ht 5' 1.5" (1.562 m)  Wt 173 lb 3.2 oz (78.563 kg)  BMI 32.20 kg/m2  SpO2 100%       Objective:   Physical Exam  Physical Exam  Constitutional: She is oriented to person, place, and time. She appears well-developed and well-nourished. No distress.  HENT:  Head: Normocephalic and atraumatic.  Right Ear: Tympanic membrane and ear canal normal.  Left Ear: Tympanic membrane and ear canal normal.    Mouth/Throat: Oropharynx is obstructed by tongue  Eyes: Pupils are equal, round, and reactive to light. No scleral icterus.  Neck: Normal range of motion. No thyromegaly present.  Cardiovascular: Normal rate and regular rhythm.   No murmur heard. Pulmonary/Chest: Effort normal and breath sounds normal. No respiratory distress. He has no wheezes. She has no rales. She exhibits no tenderness.  Abdominal: Soft. Bowel sounds are normal. He exhibits no distension and no mass. There is no tenderness. There is no rebound and no guarding.  Musculoskeletal: She exhibits no edema.  Lymphadenopathy:    She has no cervical adenopathy.  Neurological: She is alert and oriented to person, place, and time. She has normal patellar reflexes. She exhibits normal muscle tone. Coordination normal.  Skin: Skin is warm and dry.  Psychiatric: She has a normal mood and affect. Her behavior is normal. Judgment and thought content normal.  Breasts: Examined lying Right: Without masses, retractions, discharge or axillary adenopathy.  Left: Without masses, retractions, discharge or axillary adenopathy.          Assessment & Plan:         Assessment & Plan:  EKG tracing is personally reviewed.  EKG notes NSR.  No acute changes.

## 2015-10-07 ENCOUNTER — Telehealth: Payer: Self-pay | Admitting: Family

## 2015-10-07 LAB — CBC WITH DIFFERENTIAL/PLATELET
BASOS PCT: 0.3 % (ref 0.0–3.0)
Basophils Absolute: 0 10*3/uL (ref 0.0–0.1)
EOS ABS: 0.2 10*3/uL (ref 0.0–0.7)
Eosinophils Relative: 1.4 % (ref 0.0–5.0)
HCT: 44.6 % (ref 36.0–46.0)
HEMOGLOBIN: 14.9 g/dL (ref 12.0–15.0)
Lymphocytes Relative: 22.3 % (ref 12.0–46.0)
Lymphs Abs: 3 10*3/uL (ref 0.7–4.0)
MCHC: 33.5 g/dL (ref 30.0–36.0)
MCV: 86.5 fl (ref 78.0–100.0)
MONO ABS: 0.6 10*3/uL (ref 0.1–1.0)
Monocytes Relative: 4.4 % (ref 3.0–12.0)
Neutro Abs: 9.5 10*3/uL — ABNORMAL HIGH (ref 1.4–7.7)
Neutrophils Relative %: 71.6 % (ref 43.0–77.0)
PLATELETS: 433 10*3/uL — AB (ref 150.0–400.0)
RBC: 5.16 Mil/uL — ABNORMAL HIGH (ref 3.87–5.11)
RDW: 13.2 % (ref 11.5–15.5)
WBC: 13.2 10*3/uL — AB (ref 4.0–10.5)

## 2015-10-07 LAB — HEPATIC FUNCTION PANEL
ALT: 29 U/L (ref 0–35)
AST: 24 U/L (ref 0–37)
Albumin: 4.7 g/dL (ref 3.5–5.2)
Alkaline Phosphatase: 41 U/L (ref 39–117)
BILIRUBIN DIRECT: 0.1 mg/dL (ref 0.0–0.3)
BILIRUBIN TOTAL: 0.5 mg/dL (ref 0.2–1.2)
Total Protein: 7.6 g/dL (ref 6.0–8.3)

## 2015-10-07 LAB — BASIC METABOLIC PANEL
BUN: 10 mg/dL (ref 6–23)
CHLORIDE: 99 meq/L (ref 96–112)
CO2: 22 mEq/L (ref 19–32)
CREATININE: 0.71 mg/dL (ref 0.40–1.20)
Calcium: 9.8 mg/dL (ref 8.4–10.5)
GFR: 94.15 mL/min (ref >60.00)
Glucose, Bld: 104 mg/dL — ABNORMAL HIGH (ref 70–99)
Potassium: 3.5 mEq/L (ref 3.5–5.1)
Sodium: 138 mEq/L (ref 135–145)

## 2015-10-07 LAB — TSH: TSH: 1.81 u[IU]/mL (ref 0.35–4.50)

## 2015-10-07 LAB — LIPID PANEL
CHOL/HDL RATIO: 8
Cholesterol: 233 mg/dL — ABNORMAL HIGH (ref 0–200)
HDL: 31 mg/dL — AB (ref >39.00)
LDL CALC: 177 mg/dL — AB (ref 0–99)
NONHDL: 202.26
TRIGLYCERIDES: 127 mg/dL (ref 0.0–149.0)
VLDL: 25.4 mg/dL (ref 0.0–40.0)

## 2015-10-07 MED ORDER — HYDROCHLOROTHIAZIDE 25 MG PO TABS: 25.0000 mg | ORAL_TABLET | Freq: Every day | ORAL | Status: DC

## 2015-10-07 NOTE — Telephone Encounter (Signed)
Pharmacy: [id number] - [place], [place] - [address]  Reason for call: please call in hydrochlorothiazide. Pt took last one today.

## 2015-10-07 NOTE — Telephone Encounter (Signed)
Refill sent today. Notified pt.

## 2015-10-09 ENCOUNTER — Telehealth: Payer: Self-pay | Admitting: Family

## 2015-10-09 DIAGNOSIS — E785 Hyperlipidemia, unspecified: Secondary | ICD-10-CM

## 2015-10-09 MED ORDER — ATORVASTATIN CALCIUM 20 MG PO TABS: 20.0000 mg | ORAL_TABLET | Freq: Every day | ORAL | Status: DC

## 2015-10-09 NOTE — Telephone Encounter (Signed)
kidney function and electrolytes are normal. Sugar is mildly elevated, but not in the diabetic region. Cholesterol is elevated.  I would recommend that she begin Statin to reduce risk of heart attack and stroke.  I would recommend that she start this medication after her hysterectomy because it is not safe to become pregnant on statin.  Repeat Lipid 6 weeks after beginning statin.

## 2015-10-13 NOTE — Telephone Encounter (Signed)
Notified pt and she is agreeable to start medication but will wait until after hysterectomy on 11/10/15. Pt will call us back to schedule fasting lipids for 6 wks after she starts medication.

## 2015-10-14 ENCOUNTER — Ambulatory Visit (HOSPITAL_BASED_OUTPATIENT_CLINIC_OR_DEPARTMENT_OTHER)
Admission: RE | Admit: 2015-10-14 | Discharge: 2015-10-14 | Disposition: A | Discharge status: Home / Self Care | Payer: Managed Care, Other (non HMO) | Source: Ambulatory Visit | Attending: Family | Admitting: Family

## 2015-10-14 DIAGNOSIS — Z1231 Encounter for screening mammogram for malignant neoplasm of breast: Secondary | ICD-10-CM | POA: Diagnosis present

## 2015-10-14 DIAGNOSIS — Z0000 Encounter for general adult medical examination without abnormal findings: Secondary | ICD-10-CM

## 2015-10-24 ENCOUNTER — Telehealth: Payer: Self-pay | Admitting: Family

## 2015-10-30 HISTORY — PX: TOTAL ABDOMINAL HYSTERECTOMY: SHX209

## 2015-10-30 HISTORY — PX: ABDOMINAL HYSTERECTOMY: SHX81

## 2015-11-07 ENCOUNTER — Ambulatory Visit: Payer: Managed Care, Other (non HMO) | Admitting: Family

## 2015-11-10 DIAGNOSIS — N393 Stress incontinence (female) (male): Secondary | ICD-10-CM | POA: Insufficient documentation

## 2015-11-10 DIAGNOSIS — N809 Endometriosis, unspecified: Secondary | ICD-10-CM

## 2015-11-10 HISTORY — DX: Endometriosis, unspecified: N80.9

## 2015-12-03 ENCOUNTER — Other Ambulatory Visit: Payer: Self-pay | Admitting: Family

## 2015-12-17 NOTE — Telephone Encounter (Signed)
Complete

## 2016-01-07 ENCOUNTER — Telehealth: Payer: Self-pay | Admitting: *Deleted

## 2016-01-07 MED ORDER — ATORVASTATIN CALCIUM 20 MG PO TABS: 20.0000 mg | ORAL_TABLET | Freq: Every day | ORAL | 0 refills | Status: DC

## 2016-01-07 MED ORDER — AMLODIPINE BESYLATE 5 MG PO TABS: 5.0000 mg | ORAL_TABLET | Freq: Every day | ORAL | 0 refills | Status: DC

## 2016-01-07 NOTE — Telephone Encounter (Signed)
[facility]lod[facility]ine and atorvastatin. Pt last seen 10/06/15 and started on Amlodipine. Pt was advised to return in 1 month and is past due for f/u. 30 day supply will be sent. My chart message sent to pt reminding her to schedule appt before further refills are due.

## 2016-01-08 ENCOUNTER — Telehealth: Payer: Self-pay | Admitting: Family

## 2016-01-08 NOTE — Telephone Encounter (Signed)
Called pt and made her aware.   Pt scheduled F/U for 01/26/16 at 2:45p.

## 2016-01-08 NOTE — Telephone Encounter (Signed)
A 30 day supply of these medications was filled to the pharmacy on 01/07/16. Patient needs to schedule an appointment for follow-up before further refills can be authorized. Please reach out to the patient to schedule an appointment.

## 2016-01-08 NOTE — Telephone Encounter (Signed)
Refill request for 2 medications.   1. amLODipine  2. atorvastatin  Pharmacy: [id number] - [place], [place] - [address]

## 2016-01-10 ENCOUNTER — Other Ambulatory Visit: Payer: Self-pay | Admitting: Family

## 2016-01-26 ENCOUNTER — Ambulatory Visit (INDEPENDENT_AMBULATORY_CARE_PROVIDER_SITE_OTHER): Payer: Managed Care, Other (non HMO) | Admitting: Family

## 2016-01-26 ENCOUNTER — Encounter: Payer: Self-pay | Admitting: Family

## 2016-01-26 DIAGNOSIS — K5900 Constipation, unspecified: Secondary | ICD-10-CM | POA: Diagnosis not present

## 2016-01-26 DIAGNOSIS — E785 Hyperlipidemia, unspecified: Secondary | ICD-10-CM

## 2016-01-26 DIAGNOSIS — I10 Essential (primary) hypertension: Secondary | ICD-10-CM

## 2016-01-26 DIAGNOSIS — Z23 Encounter for immunization: Secondary | ICD-10-CM

## 2016-01-26 DIAGNOSIS — K5909 Other constipation: Secondary | ICD-10-CM | POA: Insufficient documentation

## 2016-01-26 NOTE — Patient Instructions (Signed)
Please continue to monitor your blood pressure at home. Let me know if the diastolic (bottom) number is >100 or the top number >150. Continue your healthy diet/exercise and weight loss efforts.

## 2016-01-26 NOTE — Assessment & Plan Note (Signed)
Continue diet exercise weight loss 

## 2016-01-26 NOTE — Progress Notes (Signed)
Pre visit review using our clinic review tool, if applicable. No additional management support is needed unless otherwise documented below in the visit note. 

## 2016-01-26 NOTE — Assessment & Plan Note (Signed)
BP looks ok today however she has had some high readings at home. She really does not wish to take the amlodipine. I have advised the patient to continue to check home BP's daily. Call me if BP readings >150/100. Otherwise plan follow up in 6 weeks for BP recheck in the office. She will continue to work on diet, exercise and weight loss.

## 2016-01-26 NOTE — Progress Notes (Signed)
Subjective:    Patient ID: Maria [name], female    DOB: 1970-12-22, 45 y.o.   MRN: [id number]  HPI  Maria [name] is a 46 yr old female who presents today for follow up.  HTN- reports that she has not been taking amlodipine.  She has continue the hctz. 119-138/77-117.   BP Readings from Last 3 Encounters:  01/26/16 136/87  10/06/15 (!) 150/104  06/06/15 (!) 151/105   Hyperlipidemia-  Lab Results  Component Value Date   CHOL 233 (H) 10/06/2015   HDL 31.00 (L) 10/06/2015   LDLCALC 177 (H) 10/06/2015   TRIG 127.0 10/06/2015   CHOLHDL 8 10/06/2015   Notes that she is using an otc fiber supplement, probiotic, and a stool softener.  This is helping with her constipation.   Review of Systems  Wt Readings from Last 3 Encounters:  01/26/16 179 lb 9.6 oz (81.5 kg)  10/06/15 173 lb 3.2 oz (78.6 kg)  06/06/15 169 lb 9.6 oz (76.9 kg)    Past Medical History:  Diagnosis Date  . 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 History of thyroid nodule 2011  . Vaginal prolapse 2017     Social History   Social History  . Marital status: Married    Spouse name: N/A  . Number of children: 2  . Years of education: N/A   Occupational History  . Not on file.   Social History Main Topics  . Smoking status: Never Smoker  . Smokeless tobacco: Never Used  . Alcohol use Yes     Comment: social /occasional  . Drug use: Unknown  . Sexual activity: Not on file   Other Topics Concern  . Not on file   Social History Narrative   Regular exercise:  Walks once a week   Caffeine use:  1-2 cups coffee daily   She works from home for [employer]   Married   2 children   Enjoys crafting/crocheting/Nascar    Past Surgical History:  Procedure Laterality Date  . NO PAST SURGERIES    . OOPHORECTOMY Right 07/23/15   Dr Huntley Dec    Family History  Problem Relation Age of Onset  . Hyperlipidemia Maternal Grandmother   . Hypertension Maternal Grandmother   . Diabetes Maternal Grandfather   .  Psoriasis Maternal Grandfather   . Heart disease Maternal Grandfather   . Heart disease Paternal Grandfather   . Cancer Father     prostate     Allergies  Allergen Reactions  . Oxycodone-Acetaminophen Anaphylaxis  . Asa [Aspirin] Nausea And Vomiting    Current Outpatient Prescriptions on File Prior to Visit  Medication Sig Dispense Refill  . atorvastatin (LIPITOR) 20 MG tablet Take 1 tablet (20 mg total) by mouth daily. 30 tablet 0  . hydrochlorothiazide (HYDRODIURIL) 25 MG tablet Take 1 tablet (25 mg total) by mouth daily. 30 tablet 5  . ibuprofen (ADVIL,MOTRIN) 600 MG tablet Take 1 tablet (600 mg total) by mouth 3 (three) times daily. (Patient not taking: Reported on 10/06/2015) 60 tablet 0   No current facility-administered medications on file prior to visit.     BP 136/87 (BP Location: Right Arm, Patient Position: Sitting, Cuff Size: Large)   Pulse 88   Temp 98.7 F (37.1 C) (Oral)   Resp 16   Ht 5\' 2"  (1.575 m)   Wt 179 lb 9.6 oz (81.5 kg)   SpO2 99%   BMI 32.85 kg/m       Objective:  Physical Exam  Constitutional: She is oriented to person, place, and time. She appears well-developed and well-nourished.  HENT:  Head: Normocephalic and atraumatic.  Cardiovascular: Normal rate, regular rhythm and normal heart sounds.   No murmur heard. Pulmonary/Chest: Effort normal and breath sounds normal. No respiratory distress. She has no wheezes.  Neurological: She is alert and oriented to person, place, and time.  Psychiatric: She has a normal mood and affect. Her behavior is normal. Judgment and thought content normal.          Assessment & Plan:  Flu shot today

## 2016-01-26 NOTE — Assessment & Plan Note (Signed)
Advised pt ok to continue  otc fiber supplement, probiotic, and a stool softener.

## 2016-02-16 ENCOUNTER — Other Ambulatory Visit: Payer: Self-pay | Admitting: Family

## 2016-02-25 ENCOUNTER — Other Ambulatory Visit: Payer: Self-pay | Admitting: Family

## 2016-03-15 ENCOUNTER — Ambulatory Visit (INDEPENDENT_AMBULATORY_CARE_PROVIDER_SITE_OTHER): Payer: Managed Care, Other (non HMO) | Admitting: Family

## 2016-03-15 ENCOUNTER — Encounter: Payer: Self-pay | Admitting: Family

## 2016-03-15 VITALS — BP 122/78 | HR 88 | Temp 98.6°F | Resp 16 | Ht 62.0 in | Wt 185.0 lb

## 2016-03-15 DIAGNOSIS — I10 Essential (primary) hypertension: Secondary | ICD-10-CM

## 2016-03-15 NOTE — Progress Notes (Signed)
Subjective:    Patient ID: Maria [name], female    DOB: 05/30/1971, 45 y.o.   MRN: [id number]  HPI  Maria [name] is a 45 yr old female who presents today for follow up of her hypertension.   BP Readings from Last 3 Encounters:  03/15/16 140/88  01/26/16 136/87  10/06/15 (!) 150/104   Weight gain- reports that she began exercise class   Wt Readings from Last 3 Encounters:  03/15/16 185 lb (83.9 kg)  01/26/16 179 lb 9.6 oz (81.5 kg)  10/06/15 173 lb 3.2 oz (78.6 kg)     Review of Systems    see HPI  Past Medical History:  Diagnosis Date  . 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 History of thyroid nodule 2011  . Vaginal prolapse 2017     Social History   Social History  . Marital status: Married    Spouse name: N/A  . Number of children: 2  . Years of education: N/A   Occupational History  . Not on file.   Social History Main Topics  . Smoking status: Never Smoker  . Smokeless tobacco: Never Used  . Alcohol use Yes     Comment: social /occasional  . Drug use: Unknown  . Sexual activity: Not on file   Other Topics Concern  . Not on file   Social History Narrative   Regular exercise:  Walks once a week   Caffeine use:  1-2 cups coffee daily   She works from home for [employer]   Married   2 children   Enjoys crafting/crocheting/[employer]ascar    Past Surgical History:  Procedure Laterality Date  . NO PAST SURGERIES    . OOPHORECTOMY Right 07/23/15   Dr Huntley Decomlin    Family History  Problem Relation Age of Onset  . Hyperlipidemia Maternal Grandmother   . Hypertension Maternal Grandmother   . Diabetes Maternal Grandfather   . Psoriasis Maternal Grandfather   . Heart disease Maternal Grandfather   . Heart disease Paternal Grandfather   . Cancer Father     prostate     Allergies  Allergen Reactions  . Oxycodone-Acetaminophen Anaphylaxis  . Asa [Aspirin] Nausea And Vomiting    Current Outpatient Prescriptions on File Prior to Visit  Medication Sig  Dispense Refill  . atorvastatin (LIPITOR) 20 MG tablet TAKE 1 TABLET (20 MG TOTAL) BY MOUTH DAILY. 30 tablet 5  . estradiol (VIVELLE-DOT) 0.025 MG/24HR PLACE 1 PATCH ONTO THE SKIN TWICE A WEEK.  3  . ibuprofen (ADVIL,MOTRIN) 600 MG tablet Take 1 tablet (600 mg total) by mouth 3 (three) times daily. 60 tablet 0   No current facility-administered medications on file prior to visit.     BP 122/78   Pulse 88   Temp 98.6 F (37 C) (Oral)   Resp 16   Ht 5\' 2"  (1.575 m)   Wt 185 lb (83.9 kg)   LMP 05/28/2015 (Exact Date)   SpO2 98% Comment: ROOM AIR  BMI 33.84 kg/m    Objective:   Physical Exam  Constitutional: She is oriented to person, place, and time. She appears well-developed and well-nourished.  Cardiovascular: Normal rate, regular rhythm and normal heart sounds.   No murmur heard. Pulmonary/Chest: Effort normal and breath sounds normal. No respiratory distress. She has no wheezes.  Neurological: She is alert and oriented to person, place, and time.  Psychiatric: She has a normal mood and affect. Her behavior is normal. Judgment and thought content normal.  Assessment & Plan:

## 2016-03-15 NOTE — Assessment & Plan Note (Signed)
Fair BP, some mild dbp at home.  I have advised pt ok to remain off of BP meds for now with close monitoring of BP.  Advised pt as follows:  Continue to monitor your blood pressure at home.   If you see recurrent top number >140 or bottom numbers <90 please let me know- feel free to forward me your readings by mychart.  Continue to work on [employer] diet, exercise and weight loss.

## 2016-03-15 NOTE — Patient Instructions (Addendum)
Continue to monitor your blood pressure at home.   If you see recurrent top number >140 or bottom numbers <90 please let me know- feel free to forward me your readings by mychart.  Continue to work on [employer] diet, exercise and weight loss.

## 2016-03-15 NOTE — Progress Notes (Signed)
Pre visit review using our clinic review tool, if applicable. No additional management support is needed unless otherwise documented below in the visit note. 

## 2016-04-10 ENCOUNTER — Other Ambulatory Visit: Payer: Self-pay | Admitting: Family

## 2016-04-12 NOTE — Telephone Encounter (Signed)
Please advise. This was dc'd on 10/117 office visit, reason "pt not taking."  BP Readings from Last 3 Encounters:  03/15/16 122/78  01/26/16 136/87  10/06/15 (!) 150/104

## 2016-04-13 NOTE — Telephone Encounter (Signed)
Spoke with Pt on 04/16/2016 at 4:49pm.Pt advised that she is no longer taking HCTZ and didn't request the medication.

## 2016-04-13 NOTE — Telephone Encounter (Signed)
Please contact patient and ask her how her BP readings are doing at home.  Have they gone up? Has she restarted her hctz?

## 2016-05-19 ENCOUNTER — Ambulatory Visit: Payer: Managed Care, Other (non HMO) | Admitting: Family

## 2016-06-21 ENCOUNTER — Telehealth: Payer: Self-pay | Admitting: *Deleted

## 2016-06-21 MED ORDER — ATORVASTATIN CALCIUM 20 MG PO TABS: 20.0000 mg | ORAL_TABLET | Freq: Every day | ORAL | 0 refills | Status: DC

## 2016-06-21 NOTE — Telephone Encounter (Signed)
[facility] from [facility]mg  requesting 90 day supply. Pt was due for follow up with  Melissa on 06/15/16. 90 day supply sent. Please call pt to schedule f/u soon. Thanks!

## 2016-06-22 NOTE — Telephone Encounter (Signed)
Called to make pt aware. Pt says that she will call back in to schedule her appt.

## 2016-09-27 ENCOUNTER — Telehealth: Payer: Self-pay | Admitting: *Deleted

## 2016-09-27 MED ORDER — ATORVASTATIN CALCIUM 20 MG PO TABS: 20.0000 mg | ORAL_TABLET | Freq: Every day | ORAL | 0 refills | Status: DC

## 2016-09-27 NOTE — Telephone Encounter (Signed)
Please call patient and schedule CPE with Fasting labs that she is now due for After 10/05/16 [last 10/06/15]; she will only be given #30-day supply until she completes appointment. Thanks./SLS 04/30  [facility] 20 mg Last filled by MD on 06/21/16, #90x0 Last AEX - 03/15/16 Next AEX - 3-Mths Patient was given 90-day supply [on 06/21/16] with the stipulation that she would schedule F/U office visit with labs; patient did not schedule appt/SLS 04/30   Telephone  06/21/2016 [facility]  Kathi Simpers, CMA   Medication Refill   Reason for call   Conversation: Medication Refill  (Newest Message First)  June 22, 2016  Burnett Kanaris C Johnson    1:52 PM  Note    Called to make pt aware. Pt says that she will call back in to schedule her appt.     June 21, 2016    6:19 PM  Kathi Simpers, CMA routed this conversation to [employer] C Parks Ranger, CMA   6:17 PM  Note    [facility]  requesting 90 day supply. Pt was due for follow up with  Melissa on 06/15/16. 90 day supply sent. Please call pt to schedule f/u soon. Thanks!

## 2016-09-29 NOTE — Telephone Encounter (Signed)
Pt has been scheduled.  °

## 2016-10-26 ENCOUNTER — Ambulatory Visit (INDEPENDENT_AMBULATORY_CARE_PROVIDER_SITE_OTHER): Payer: 59 | Admitting: Family

## 2016-10-26 ENCOUNTER — Encounter (HOSPITAL_BASED_OUTPATIENT_CLINIC_OR_DEPARTMENT_OTHER): Payer: Self-pay

## 2016-10-26 ENCOUNTER — Ambulatory Visit (HOSPITAL_BASED_OUTPATIENT_CLINIC_OR_DEPARTMENT_OTHER)
Admission: RE | Admit: 2016-10-26 | Discharge: 2016-10-26 | Disposition: A | Discharge status: Home / Self Care | Payer: 59 | Source: Ambulatory Visit | Attending: Family | Admitting: Family

## 2016-10-26 ENCOUNTER — Encounter: Payer: Self-pay | Admitting: Family

## 2016-10-26 VITALS — BP 136/101 | HR 81 | Temp 98.9°F | Resp 16 | Ht 62.0 in | Wt 186.0 lb

## 2016-10-26 DIAGNOSIS — E785 Hyperlipidemia, unspecified: Secondary | ICD-10-CM | POA: Diagnosis not present

## 2016-10-26 DIAGNOSIS — Z1231 Encounter for screening mammogram for malignant neoplasm of breast: Secondary | ICD-10-CM | POA: Diagnosis present

## 2016-10-26 DIAGNOSIS — Z0000 Encounter for general adult medical examination without abnormal findings: Secondary | ICD-10-CM

## 2016-10-26 DIAGNOSIS — I10 Essential (primary) hypertension: Secondary | ICD-10-CM

## 2016-10-26 LAB — BASIC METABOLIC PANEL
BUN: 15 mg/dL (ref 6–23)
CALCIUM: 9.4 mg/dL (ref 8.4–10.5)
CO2: 25 mEq/L (ref 19–32)
Chloride: 105 mEq/L (ref 96–112)
Creatinine, Ser: 0.84 mg/dL (ref 0.40–1.20)
GFR: 77.19 mL/min (ref >60.00)
Glucose, Bld: 108 mg/dL — ABNORMAL HIGH (ref 70–99)
POTASSIUM: 4.6 meq/L (ref 3.5–5.1)
SODIUM: 140 meq/L (ref 135–145)

## 2016-10-26 MED ORDER — AMLODIPINE BESYLATE 5 MG PO TABS: 5.0000 mg | ORAL_TABLET | Freq: Every day | ORAL | 3 refills | Status: DC

## 2016-10-26 NOTE — Assessment & Plan Note (Signed)
Uncontrolled. Will restart amlodipine 5mg .  Plan follow up in 1 month for bp recheck.  Obtain follow up bmet.

## 2016-10-26 NOTE — Patient Instructions (Signed)
Please complete lab work prior to leaving. Schedule mammogram on the first floor. Begin amlodipine 5mg  once daily.

## 2016-10-26 NOTE — Progress Notes (Signed)
Subjective:    Patient ID: Maria [name], female    DOB: 07/11/1970, 46 y.o.   MRN: [id number]  HPI  Ms. Maria [name] is a 46 yr old female who presents today for follow up.  1) HTN- She is not currently on antihypertensive medication.  In the past she has taken hctz and amlodipine.   BP Readings from Last 3 Encounters:  10/26/16 (!) 134/98  03/15/16 122/78  01/26/16 136/87   2) Hyperlipidemia- she is not taking lipitor.  Reports that she has been focusing on a healthy diet.     Lab Results  Component Value Date   CHOL 233 (H) 10/06/2015   HDL 31.00 (L) 10/06/2015   LDLCALC 177 (H) 10/06/2015   TRIG 127.0 10/06/2015   CHOLHDL 8 10/06/2015      Review of Systems    see HPI  Past Medical History:  Diagnosis Date  . 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 History of thyroid nodule 2011  . Vaginal prolapse 2017     Social History   Social History  . Marital status: Married    Spouse name: N/A  . Number of children: 2  . Years of education: N/A   Occupational History  . Not on file.   Social History Main Topics  . Smoking status: Never Smoker  . Smokeless tobacco: Never Used  . Alcohol use Yes     Comment: social /occasional  . Drug use: Unknown  . Sexual activity: Not on file   Other Topics Concern  . Not on file   Social History Narrative   Regular exercise:  Walks once a week   Caffeine use:  1-2 cups coffee daily   She works from home for [employer]   Married   2 children   Enjoys crafting/crocheting/[employer]ascar    Past Surgical History:  Procedure Laterality Date  . NO PAST SURGERIES    . OOPHORECTOMY Right 07/23/15   Dr Huntley Decomlin    Family History  Problem Relation Age of Onset  . Hyperlipidemia Maternal Grandmother   . Hypertension Maternal Grandmother   . Diabetes Maternal Grandfather   . Psoriasis Maternal Grandfather   . Heart disease Maternal Grandfather   . Heart disease Paternal Grandfather   . Cancer Father        prostate      Allergies  Allergen Reactions  . Oxycodone-Acetaminophen Anaphylaxis  . Asa [Aspirin] Nausea And Vomiting    Current Outpatient Prescriptions on File Prior to Visit  Medication Sig Dispense Refill  . estradiol (VIVELLE-DOT) 0.025 MG/24HR PLACE 1 PATCH ONTO THE SKIN TWICE A WEEK.  3  . ibuprofen (ADVIL,MOTRIN) 600 MG tablet Take 1 tablet (600 mg total) by mouth 3 (three) times daily. 60 tablet 0   No current facility-administered medications on file prior to visit.     BP (!) 134/98 (BP Location: Right Arm, Cuff Size: Normal)   Pulse 81   Temp 98.9 F (37.2 C) (Oral)   Resp 16   Ht 5\' 2"  (1.575 m)   Wt 186 lb (84.4 kg)   LMP 05/28/2015 (Exact Date)   SpO2 98%   BMI 34.02 kg/m    Objective:   Physical Exam  Constitutional: She appears well-developed and well-nourished.  Cardiovascular: Normal rate, regular rhythm and normal heart sounds.   No murmur heard. Pulmonary/Chest: Effort normal and breath sounds normal. No respiratory distress. She has no wheezes.  Psychiatric: She has a normal mood and affect. Her behavior is  normal. Judgment and thought content normal.          Assessment & Plan:

## 2016-10-26 NOTE — Assessment & Plan Note (Signed)
Obtain follow up lipid panel. Not currently on statin.

## 2016-11-02 ENCOUNTER — Other Ambulatory Visit: Payer: Self-pay | Admitting: Family

## 2016-11-26 ENCOUNTER — Telehealth: Payer: Self-pay | Admitting: Family

## 2016-11-26 NOTE — Telephone Encounter (Signed)
Caller name:[facility] Relationship to patient: Can be reached:[phone] Pharmacy:  Reason for call:Requesting refill on amLODipine (NORVASC) 5 MG tablet, per insurance will be cheaper for 90 day supply. Please advise

## 2016-11-29 NOTE — Telephone Encounter (Signed)
Spoke with Maria MarionRoseanna and advised that we will only do 30 day supply at this time as pt was started on medication in May and supposed to have followed up with PCP to assess stability of dose. Mychart message sent to pt.

## 2016-12-02 IMAGING — CT CT ABD-PELV W/ CM
2 of 5 series · 16 of 46 positions shown, 18 images · IV contrast (APPLIED)
Comparison: None.

CLINICAL DATA: Right lower quadrant abdominal pain and nausea
today.

EXAM:
CT ABDOMEN AND PELVIS WITH CONTRAST
TECHNIQUE: Multidetector CT imaging of the abdomen and pelvis was performed
using the standard protocol following bolus administration of
intravenous contrast.
CONTRAST:  50mL OMNIPAQUE IOHEXOL 300 MG/ML SOLN, 50mL OMNIPAQUE
IOHEXOL 300 MG/ML SOLN, 50mL OMNIPAQUE IOHEXOL 300 MG/ML SOLN

[Series 2: abd/pelvis 5.0 b31f · axial · 0.77mm/px · z∈[+605,+1025]mm · 13 of 94 slices shown, 15 images]
[im 5/94  soft-tissue]
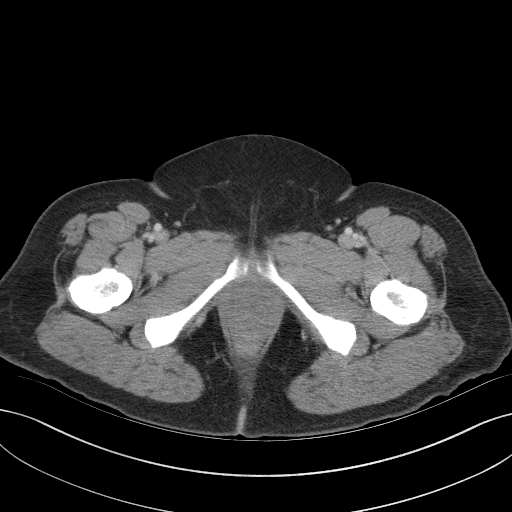
[im 5/94  bone]
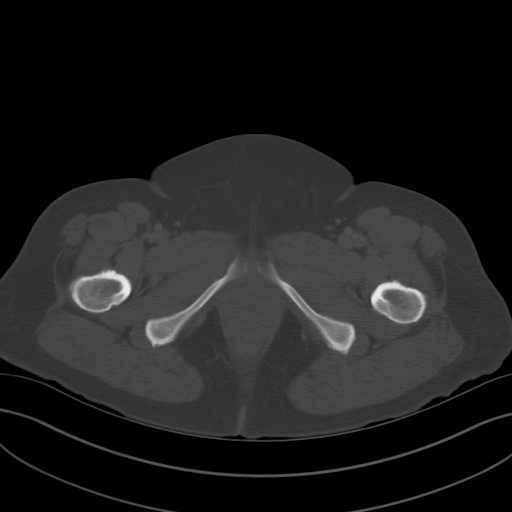
[im 15/94  soft-tissue]
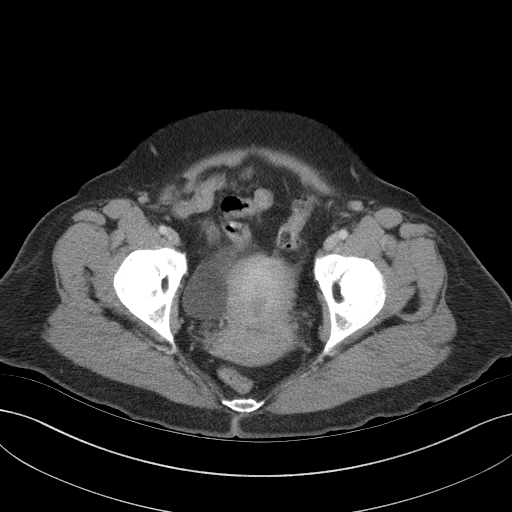
[im 20/94  soft-tissue]
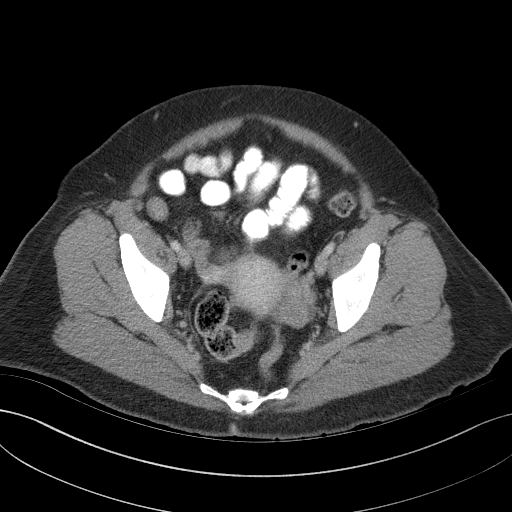
[im 25/94  soft-tissue]
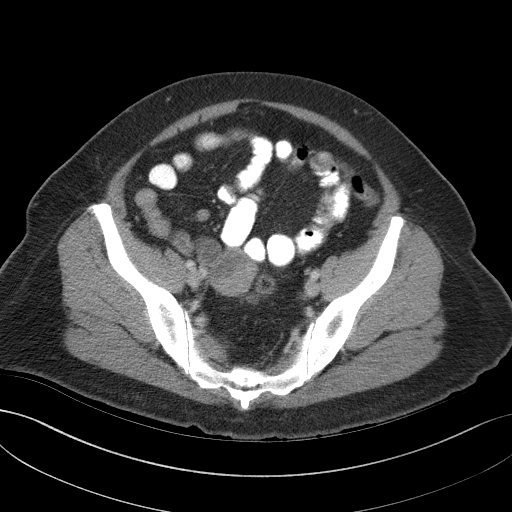
[im 35/94  soft-tissue]
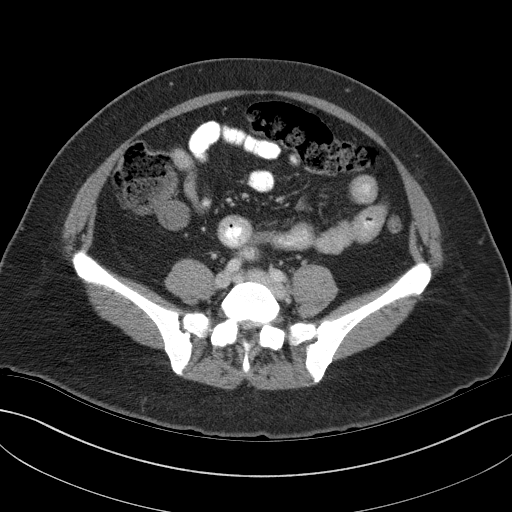
[im 40/94  soft-tissue]
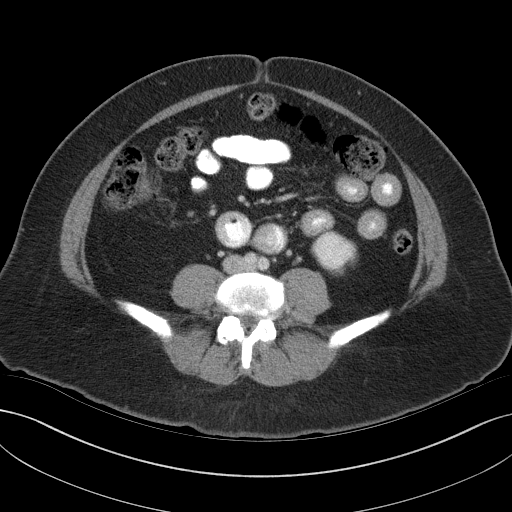
[im 49/94  soft-tissue]
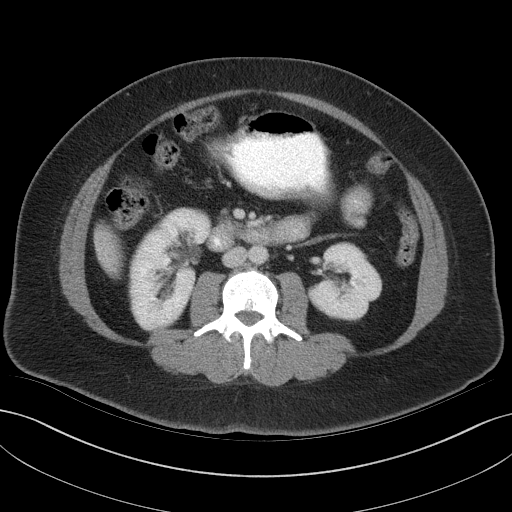
[im 54/94  soft-tissue]
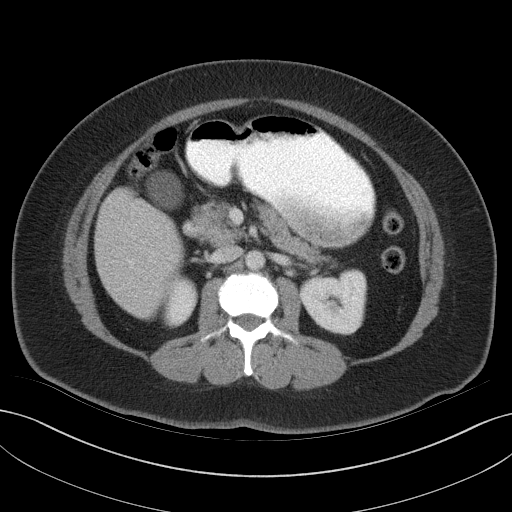
[im 59/94  soft-tissue]
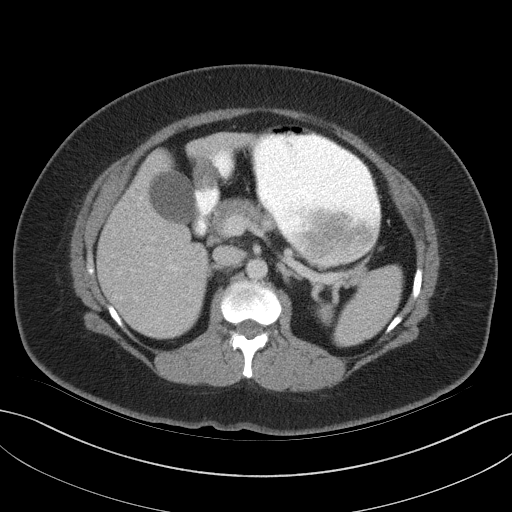
[im 59/94  bone]
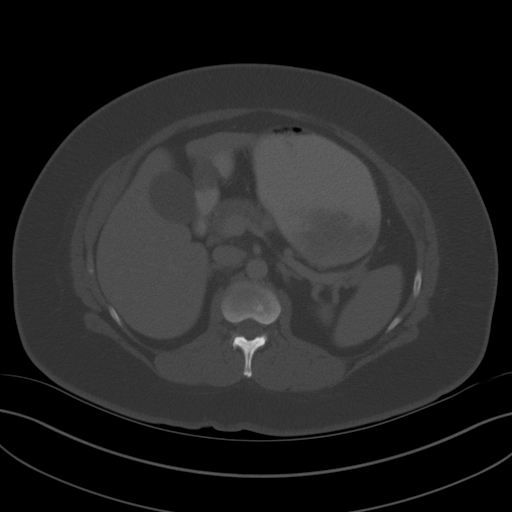
[im 69/94  soft-tissue]
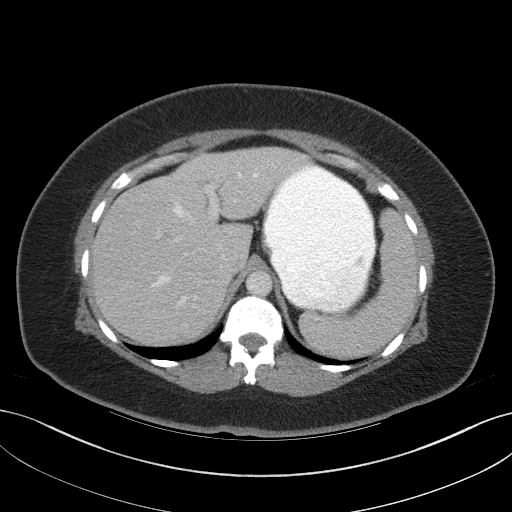
[im 74/94  soft-tissue]
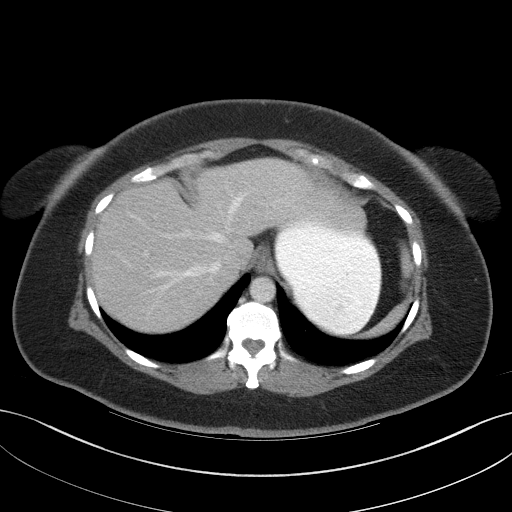
[im 79/94  soft-tissue]
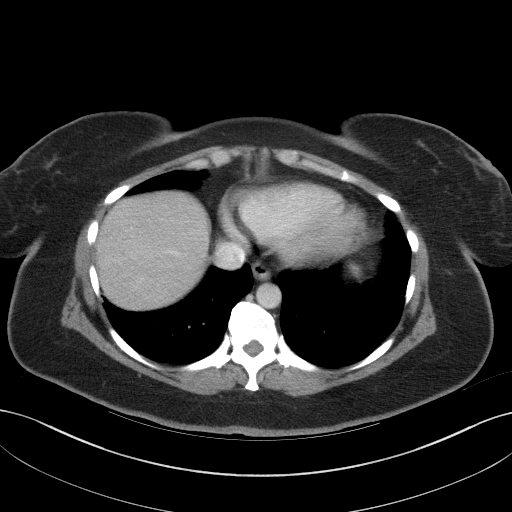
[im 89/94  soft-tissue]
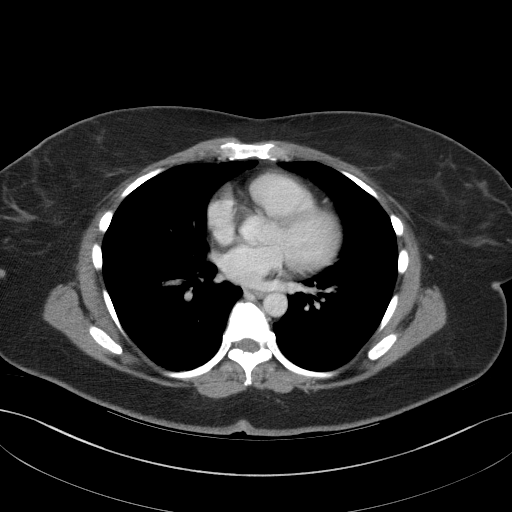

[Series 5: abd/pelvis 3.0 coronal · coronal · 0.80mm/px · 3 of 97 slices shown]
[im 33/97  soft-tissue]
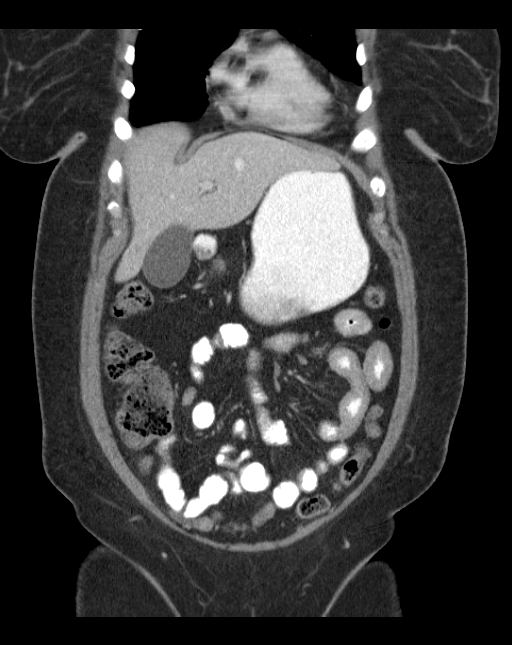
[im 43/97  soft-tissue]
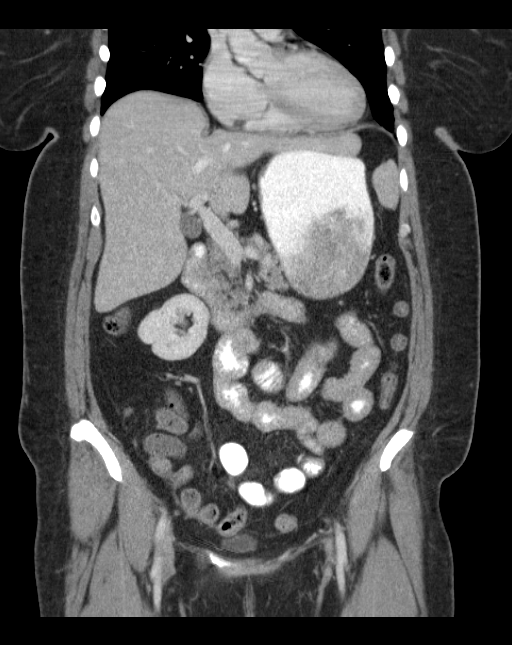
[im 54/97  soft-tissue]
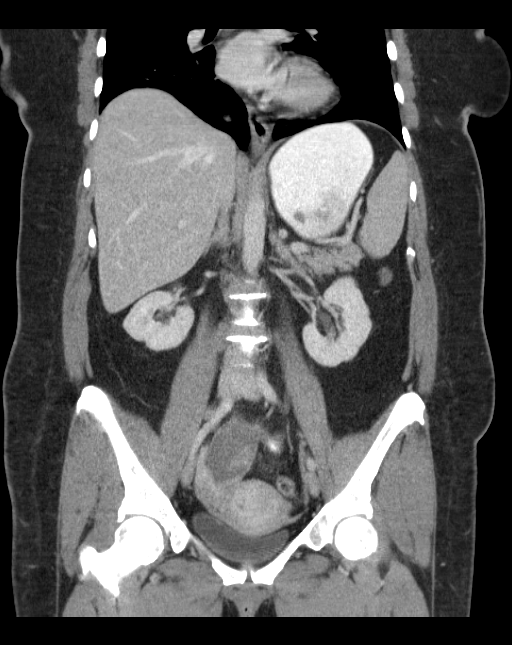

[16 of 46 positions shown; findings below may reference images not displayed]

FINDINGS: Lower chest: Small amount of linear atelectasis or scarring at both
lung bases.

Hepatobiliary: No masses or other significant abnormality.

Pancreas: No mass, inflammatory changes, or other significant
abnormality.

Spleen: Within normal limits in size and appearance.

Adrenals/Urinary Tract: No masses identified. No evidence of
hydronephrosis.

Stomach/Bowel: No evidence of obstruction, inflammatory process, or
abnormal fluid collections. Normal appearing appendix.

Vascular/Lymphatic: No arterial calcifications or enlarged lymph
nodes.

Reproductive: Exophytic mass arising from the superior aspect of the
right ovary, measuring 4.0 x 3.4 cm on axial image number 69 and
cm in length on coronal image number 56. This has lower density
component anteriorly and a medium density component posteriorly with
a suggestion of a fluid/ debris level. Normal appearing uterus and
left ovary.

Other: Minimal free peritoneal fluid in the pelvis, within normal
limits of physiological fluid.

Musculoskeletal: Lower thoracic spine degenerative changes and mild
lumbar spine degenerative changes.
IMPRESSION: 5.5 x 4.0 x 3.4 cm probable hemorrhagic right ovarian cyst. This
could also represent an endometrioma. A neoplasm is significantly
less likely. This could be followed with a transabdominal and
transvaginal pelvic ultrasound in 2 months.

## 2016-12-06 ENCOUNTER — Ambulatory Visit (INDEPENDENT_AMBULATORY_CARE_PROVIDER_SITE_OTHER): Payer: 59 | Admitting: Family

## 2016-12-06 ENCOUNTER — Encounter: Payer: Self-pay | Admitting: Family

## 2016-12-06 VITALS — BP 129/82 | HR 84 | Temp 98.4°F | Resp 16 | Ht 63.0 in | Wt 191.2 lb

## 2016-12-06 DIAGNOSIS — I10 Essential (primary) hypertension: Secondary | ICD-10-CM

## 2016-12-06 DIAGNOSIS — M5410 Radiculopathy, site unspecified: Secondary | ICD-10-CM | POA: Diagnosis not present

## 2016-12-06 MED ORDER — AMLODIPINE BESYLATE 5 MG PO TABS: 5.0000 mg | ORAL_TABLET | Freq: Every day | ORAL | 1 refills | Status: DC

## 2016-12-06 MED ORDER — MELOXICAM 7.5 MG PO TABS: 7.5000 mg | ORAL_TABLET | Freq: Every day | ORAL | 0 refills | Status: DC

## 2016-12-06 NOTE — Progress Notes (Signed)
Subjective:    Patient ID: Maria [name], female    DOB: 1970-10-16, 46 y.o.   MRN: [id number]  HPI   Patient presents today for follow-up of her blood pressure. Last visit we started amlodipine 5mg  once daily. She reports tolerating without difficulty.Denies LE edema.  BP Readings from Last 3 Encounters:  12/06/16 129/82  10/26/16 (!) 136/101  03/15/16 122/78   Reports pain which radiates down the back of her right leg and radiate into her foot.  Worse with prolonged sitting or walking.  Has some numbness in her right foot. Numbness is intermittent and positional in nature. She denies back pain. Has taken some ibuprofen with some improvement in her symptoms.   Review of Systems   See HPI  Past Medical History:  Diagnosis Date  . 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 History of thyroid nodule 2011  . Vaginal prolapse 2017     Social History   Social History  . Marital status: Married    Spouse name: N/A  . Number of children: 2  . Years of education: N/A   Occupational History  . Not on file.   Social History Main Topics  . Smoking status: Never Smoker  . Smokeless tobacco: Never Used  . Alcohol use Yes     Comment: social /occasional  . Drug use: Unknown  . Sexual activity: Not on file   Other Topics Concern  . Not on file   Social History Narrative   Regular exercise:  Walks once a week   Caffeine use:  1-2 cups coffee daily   She works from home for [employer]   Married   2 children   Enjoys crafting/crocheting/[employer]    Past Surgical History:  Procedure Laterality Date  . ABDOMINAL HYSTERECTOMY    . NO PAST SURGERIES    . OOPHORECTOMY Right 07/23/15   Dr Huntley Dec  . OOPHORECTOMY      Family History  Problem Relation Age of Onset  . Hyperlipidemia Maternal Grandmother   . Hypertension Maternal Grandmother   . Diabetes Maternal Grandfather   . Psoriasis Maternal Grandfather   . Heart disease Maternal Grandfather   . Heart disease Paternal  Grandfather   . Cancer Father        prostate     Allergies  Allergen Reactions  . Oxycodone-Acetaminophen Anaphylaxis  . Asa [Aspirin] Nausea And Vomiting    Current Outpatient Prescriptions on File Prior to Visit  Medication Sig Dispense Refill  . estradiol (VIVELLE-DOT) 0.025 MG/24HR PLACE 1 PATCH ONTO THE SKIN TWICE A WEEK.  3  . ibuprofen (ADVIL,MOTRIN) 600 MG tablet Take 1 tablet (600 mg total) by mouth 3 (three) times daily. 60 tablet 0   No current facility-administered medications on file prior to visit.     BP 129/82 (BP Location: Left Arm, Cuff Size: Large)   Pulse 84   Temp 98.4 F (36.9 C) (Oral)   Resp 16   Ht 5\' 3"  (1.6 m)   Wt 191 lb 3.2 oz (86.7 kg)   LMP 05/28/2015 (Exact Date)   SpO2 100%   BMI 33.87 kg/m    Objective:   Physical Exam  Constitutional: She is oriented to person, place, and time. She appears well-developed and well-nourished.  Cardiovascular: Normal rate, regular rhythm and normal heart sounds.   No murmur heard. Pulmonary/Chest: Effort normal and breath sounds normal. No respiratory distress. She has no wheezes.  Neurological: She is alert and oriented to person, place,  and time.  Steady even gait  Psychiatric: She has a normal mood and affect. Her behavior is normal. Judgment and thought content normal.          Assessment & Plan:  Radicular pain- L5 distribution.  Trial of meloxicam once daily.   HTN- improved.  Continue amlodipine 5mg  once daily.

## 2016-12-06 NOTE — Patient Instructions (Signed)
Continue amlodipine once daily. Add meloxicam for back/leg pain. Call if symptoms worsen or if not improved in 2 weeks.

## 2016-12-28 DIAGNOSIS — H40003 Preglaucoma, unspecified, bilateral: Secondary | ICD-10-CM | POA: Insufficient documentation

## 2016-12-28 DIAGNOSIS — Z83511 Family history of glaucoma: Secondary | ICD-10-CM | POA: Insufficient documentation

## 2016-12-31 ENCOUNTER — Other Ambulatory Visit: Payer: Self-pay | Admitting: Family

## 2017-01-03 MED ORDER — MELOXICAM 7.5 MG PO TABS: 7.5000 mg | ORAL_TABLET | Freq: Every day | ORAL | 0 refills | Status: DC

## 2017-01-31 ENCOUNTER — Other Ambulatory Visit: Payer: Self-pay | Admitting: Family

## 2017-02-05 ENCOUNTER — Other Ambulatory Visit: Payer: Self-pay | Admitting: Family

## 2017-03-05 ENCOUNTER — Other Ambulatory Visit: Payer: Self-pay | Admitting: Family

## 2017-03-08 ENCOUNTER — Other Ambulatory Visit: Payer: Self-pay | Admitting: Family

## 2017-03-08 MED ORDER — MELOXICAM 7.5 MG PO TABS: 7.5000 mg | ORAL_TABLET | Freq: Every day | ORAL | 0 refills | Status: DC

## 2017-03-28 ENCOUNTER — Telehealth: Payer: Self-pay | Admitting: Family

## 2017-03-28 NOTE — Telephone Encounter (Signed)
Ok with me 

## 2017-03-28 NOTE — Telephone Encounter (Signed)
Patient would like to transfer out of the [place] office. It is too hard to schedule appointments in that office. She would like to schedule an early Friday morning appt with Dr. Claiborne BillingsKuneff.

## 2017-03-29 NOTE — Telephone Encounter (Signed)
Ok with me 

## 2017-03-30 NOTE — Telephone Encounter (Signed)
Patient scheduled 03/31/17

## 2017-03-31 ENCOUNTER — Ambulatory Visit (INDEPENDENT_AMBULATORY_CARE_PROVIDER_SITE_OTHER): Payer: 59 | Admitting: Family Medicine

## 2017-03-31 ENCOUNTER — Encounter: Payer: Self-pay | Admitting: Family Medicine

## 2017-03-31 VITALS — BP 136/90 | HR 76 | Temp 98.4°F | Resp 20 | Ht 63.0 in | Wt 202.8 lb

## 2017-03-31 DIAGNOSIS — E041 Nontoxic single thyroid nodule: Secondary | ICD-10-CM | POA: Diagnosis not present

## 2017-03-31 DIAGNOSIS — I10 Essential (primary) hypertension: Secondary | ICD-10-CM | POA: Diagnosis not present

## 2017-03-31 DIAGNOSIS — Z7689 Persons encountering health services in other specified circumstances: Secondary | ICD-10-CM

## 2017-03-31 DIAGNOSIS — Z8639 Personal history of other endocrine, nutritional and metabolic disease: Secondary | ICD-10-CM

## 2017-03-31 DIAGNOSIS — E782 Mixed hyperlipidemia: Secondary | ICD-10-CM | POA: Diagnosis not present

## 2017-03-31 DIAGNOSIS — F418 Other specified anxiety disorders: Secondary | ICD-10-CM | POA: Diagnosis not present

## 2017-03-31 LAB — CBC WITH DIFFERENTIAL/PLATELET
BASOS ABS: 0.1 10*3/uL (ref 0.0–0.1)
Basophils Relative: 1 % (ref 0.0–3.0)
Eosinophils Absolute: 0.2 10*3/uL (ref 0.0–0.7)
Eosinophils Relative: 2.5 % (ref 0.0–5.0)
HCT: 43.9 % (ref 36.0–46.0)
HEMOGLOBIN: 14.7 g/dL (ref 12.0–15.0)
LYMPHS ABS: 2.1 10*3/uL (ref 0.7–4.0)
Lymphocytes Relative: 29.4 % (ref 12.0–46.0)
MCHC: 33.5 g/dL (ref 30.0–36.0)
MCV: 90.2 fl (ref 78.0–100.0)
Monocytes Absolute: 0.4 10*3/uL (ref 0.1–1.0)
Monocytes Relative: 6.1 % (ref 3.0–12.0)
NEUTROS PCT: 61 % (ref 43.0–77.0)
Neutro Abs: 4.4 10*3/uL (ref 1.4–7.7)
Platelets: 312 10*3/uL (ref 150.0–400.0)
RBC: 4.87 Mil/uL (ref 3.87–5.11)
RDW: 12.5 % (ref 11.5–15.5)
WBC: 7.2 10*3/uL (ref 4.0–10.5)

## 2017-03-31 LAB — T4, FREE: Free T4: 0.77 ng/dL (ref 0.60–1.60)

## 2017-03-31 LAB — T3, FREE: T3, Free: 3.6 pg/mL (ref 2.3–4.2)

## 2017-03-31 LAB — TSH: TSH: 2.25 u[IU]/mL (ref 0.35–4.50)

## 2017-03-31 MED ORDER — ESCITALOPRAM OXALATE 10 MG PO TABS: 10.0000 mg | ORAL_TABLET | Freq: Every day | ORAL | 0 refills | Status: DC

## 2017-03-31 MED ORDER — MELOXICAM 7.5 MG PO TABS: 7.5000 mg | ORAL_TABLET | Freq: Every day | ORAL | 3 refills | Status: DC

## 2017-03-31 NOTE — Progress Notes (Signed)
Patient ID: Maria [name], female  DOB: 1970-11-12, 46 y.o.   MRN: [id number] Patient Care Team    Relationship Specialty Notifications Start End  Natalia Leatherwood, DO PCP - General Family Medicine  03/31/17   Erle Crocker, MD Consulting Physician Glaucoma Ophthalmology  03/31/17   Jerrell Mylar, MD Consulting Physician Gynecology  03/31/17     Chief Complaint  Patient presents with  . Establish Care  . Depression  . Anxiety    Subjective:  Maria [name] is a 46 y.o.  female present for new patient establishment. All past medical history, surgical history, allergies, family history, immunizations, medications and social history were obtained and entered in the electronic medical record today. All recent labs, ED visits and hospitalizations within the last year were reviewed.  Depression with anxiety: Patient reports worsening depression with anxiety over the last few months. Her husband has been rather ill and now the hospital with multiple surgeries and she is starting to feel overwhelmed. She feels she has increased emotions, sadness and is just not as happy. She had been on Lexapro many years ago short-term and seemed to do okay with medication. She does not recall the dose that she had been on. She is very adamant she doesn't want to be on a medication where she will gain weight. She is seeing a therapist, started there 3 weeks ago and they are chronic continue to see each other 2 times a week. She does feel this is very helpful for her.  Thyroid nodules: History of thyroid nodules, no follow-up needed per radiological report 2013  Hypertension/hyperlipidemia: Pt reports compliance with amlodipine 5 mg daily. Blood pressures ranges at home routinely monitored. Patient denies chest pain, shortness of breath or lower extremity edema. Pt does not take a daily baby ASA. Pt is not prescribed statin. RF: Hypertension, hyperlipidemia, obesity, Fhx heart disease   Mood  disorder screening today negative  Depression screen [facility] 2/9 03/31/2017 10/26/2016 03/15/2016  Decreased Interest 2 1 0  Down, Depressed, Hopeless 2 1 0  PHQ - 2 Score 4 2 0  Altered sleeping 1 1 -  Tired, decreased energy 2 1 -  Change in appetite 2 1 -  Feeling bad or failure about yourself  2 0 -  Trouble concentrating 2 1 -  Moving slowly or fidgety/restless 2 0 -  Suicidal thoughts 0 0 -  PHQ-9 Score 15 6 -  Difficult doing work/chores Very difficult - -   GAD 7 : Generalized Anxiety Score 03/31/2017  Nervous, Anxious, on Edge 2  Control/stop worrying 2  Worry too much - different things 2  Trouble relaxing 2  Restless 2  Easily annoyed or irritable 2  Afraid - awful might happen 3  Total GAD 7 Score 15  Anxiety Difficulty Very difficult      Current Exercise Habits: Home exercise routine, Type of exercise: walking, Time (Minutes): 20, Frequency (Times/Week): 5, Weekly Exercise (Minutes/Week): 100, Intensity: Mild Exercise limited by: None identified Fall Risk  03/31/2017  Falls in the past year? No     Immunization History  Administered Date(s) Administered  . Influenza Split 04/17/2012  . Influenza,inj,Quad PF,6+ Mos 03/28/2014, 01/26/2016  . Influenza-Unspecified 03/16/2017  . Tdap 04/30/2014    No exam data present  Past Medical History:  Diagnosis Date  . Depression with anxiety   . 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 History of thyroid nodule 2011  . Hypertension   .  Vaginal prolapse 2017   Allergies  Allergen Reactions  . Oxycodone-Acetaminophen Anaphylaxis  . Asa [Aspirin] Nausea And Vomiting   Past Surgical History:  Procedure Laterality Date  . ABDOMINAL HYSTERECTOMY  10/2015  . OOPHORECTOMY Right 07/23/15   Dr Huntley Dec   Family History  Problem Relation Age of Onset  . Hyperlipidemia Maternal Grandmother   . Hypertension Maternal Grandmother   . Diabetes Maternal Grandfather   . Psoriasis Maternal Grandfather   . Heart  disease Maternal Grandfather   . Hyperlipidemia Maternal Grandfather   . Hypertension Maternal Grandfather   . Heart disease Paternal Grandfather   . Breast cancer Mother   . Prostate cancer Father    Social History   Social History  . Marital status: Married    Spouse name: N/A  . Number of children: 2  . Years of education: 14   Occupational History  . Benefits consultant    Social History Main Topics  . Smoking status: Never Smoker  . Smokeless tobacco: Never Used  . Alcohol use No  . Drug use: No  . Sexual activity: Yes    Partners: Male   Other Topics Concern  . Not on file   Social History Narrative   Married with 2 children.   Smoke alarm in the home, wears her seatbelt.   Regular exercise:  Walks once a week   Caffeine use:  1-2 cups coffee daily   She works as a Agricultural engineer.    She has an associates degree.   Enjoys crafting/crocheting/Nascar   Feels safe in her relationships.   Allergies as of 03/31/2017      Reactions   Oxycodone-acetaminophen Anaphylaxis   Asa [aspirin] Nausea And Vomiting      Medication List       Accurate as of 03/31/17  1:33 PM. Always use your most recent med list.          amLODipine 5 MG tablet Commonly known as:  NORVASC Take 1 tablet (5 mg total) by mouth daily.   escitalopram 10 MG tablet Commonly known as:  LEXAPRO Take 1 tablet (10 mg total) by mouth daily.   estradiol 0.025 MG/24HR Commonly known as:  VIVELLE-DOT PLACE 1 PATCH ONTO THE SKIN TWICE A WEEK.   meloxicam 7.5 MG tablet Commonly known as:  MOBIC Take 1 tablet (7.5 mg total) by mouth daily.   TURMERIC PO Take by mouth.       All past medical history, surgical history, allergies, family history, immunizations andmedications were updated in the EMR today and reviewed under the history and medication portions of their EMR.    No results found for this or any previous visit (from the past 2160 hour(s)).  Mm Digital Screening  Result  Date: 10/26/2016 CLINICAL DATA:  Screening. EXAM: DIGITAL SCREENING BILATERAL MAMMOGRAM WITH CAD COMPARISON:  Previous exam(s). ACR Breast Density Category b: There are scattered areas of fibroglandular density. FINDINGS: There are no findings suspicious for malignancy. Images were processed with CAD. IMPRESSION: No mammographic evidence of malignancy. A result letter of this screening mammogram will be mailed directly to the patient. RECOMMENDATION: Screening mammogram in one year. (Code:SM-B-01Y) BI-RADS CATEGORY  1: Negative. Electronically Signed   By: Bary Richard M.D.   On: 10/26/2016 15:53     ROS: 14 pt review of systems performed and negative (unless mentioned in an HPI)  Objective: BP 136/90 (BP Location: Right Arm, Patient Position: Sitting, Cuff Size: Large)   Pulse 76   Temp 98.4  F (36.9 C)   Resp 20   Ht 5\' 3"  (1.6 m)   Wt 202 lb 12 oz (92 kg)   LMP 05/28/2015 (Exact Date)   SpO2 98%   BMI 35.92 kg/m  Gen: Afebrile. No acute distress. Nontoxic in appearance, well-developed, well-nourished,  obese, very pleasant Caucasian female. HENT: AT. . MMM, no oral lesions, adequate dentition Eyes:Pupils Equal Round Reactive to light, Extraocular movements intact,  Conjunctiva without redness, discharge or icterus. Neck/lymp/endocrine: Supple, no lymphadenopathy, mild fullness thyromegaly CV: RRR no murmur, no edema, +2/4 P posterior tibialis pulses. No carotid bruits. No JVD. Chest: CTAB, no wheeze, rhonchi or crackles. Normal Respiratory effort. Good Air movement. Abd: Soft. Obese. NTND. BS present.  Skin: Warm and well-perfused. Skin intact. Neuro/Msk:  Normal gait. PERLA. EOMi. Alert. Oriented x3.   Psych: She appears overwhelmed and sad. Otherwise, Normal affect, dress and demeanor. Normal speech. Normal thought content and judgment.   Assessment/plan: Maria [name] is a 46 y.o. female present for establishing care with a new physician within the same group. Essential  hypertension/BMI 35/hyperlipidemia - Borderline, better control needed. - Continue amlodipine 5 mg daily. She will monitor her blood pressures over the next 4 weeks, and if continues to be borderline at home and on next visit, will increase her amlodipine. - Discussed her elevated cholesterol on last check, she's not on any medications. Would be agreeable to check on her next appointment in 4 weeks if she would come fasting, otherwise we will need to get her in for physical soon. - Low-sodium diet, exercise encouraged. - CBC w/Diff - TSH - T3, free - T4, free  History of thyroid nodule - No follow-up required per ultrasound result collected thousand 13 - TSH - T3, free - T4, free  Depression with anxiety - New onset depression with anxiety. Discussed different types of medications and agreed on starting Lexapro 10 mg daily. She has been on the medication in the past and seemed to do well with it. - Continue with therapist 2 times a week, she has a good relationship with this particular therapist and it seems to be helpful for her. - Follow-up 4 weeks.   Return in about 4 weeks (around 04/28/2017) for Depression/anxiety/hypertension/hyperlipidemia.   Greater than 40 minutes spent with patient, >50% of time spent face to face counseling, coordinating care establishing patient edition provider.    Note is dictated utilizing voice recognition software. Although note has been proof read prior to signing, occasional typographical errors still can be missed. If any questions arise, please do not hesitate to call for verification.  Electronically signed by: Felix Pacinienee Kuneff, DO [facility]

## 2017-03-31 NOTE — Patient Instructions (Signed)
1. Hypertension: Take BP at least every other day and record. Bring to next visit.  2. Start lexapro daily. Follow up in 4 weeks, before med runs out.   3. Mobic is a good and rather safe medicine.    It was nice to see you today.    Please help us help you:  We are honored [facility]. Below you will find basic instructions that you may need to access in the future. Please help us help you by reading the instructions, which cover many of the frequent questions we experience.   Prescription refills and request:  -In order to allow more efficient response time, please call your pharmacy for all refills. They will forward the request electronically to us. This allows for the quickest possible response. Request left on a nurse line can take longer to refill, since these are checked as time allows between office patients and other phone calls.  - refill request can take up to 3-5 working days to complete.  - If request is sent electronically and request is appropiate, it is usually completed in 1-2 business days.  - all patients will need to be seen routinely for all chronic medical conditions requiring prescription medications (see follow-up below). If you are overdue for follow up on your condition, you will be asked to make an appointment and we will call in enough medication to cover you until your appointment (up to 30 days).  - all controlled substances will require a face to face visit to request/refill.  - if you desire your prescriptions to go through a new pharmacy, and have an active script at original pharmacy, you will need to call your pharmacy and have scripts transferred to new pharmacy. This is completed between the pharmacy locations and not by your provider.    Results: If any images or labs were ordered, it can take up to 1 week to get results depending on the test ordered and the lab/facility running and resulting the test. -  Normal or stable results, which do not need further discussion, may be released to your mychart immediately with attached note to you. A call may not be generated for normal results. Please make certain to sign up for mychart. If you have questions on how to activate your mychart you can call the front office.  - If your results need further discussion, our office will attempt to contact you via phone, and if unable to reach you after 2 attempts, we will release your abnormal result to your mychart with instructions.  - All results will be automatically released in mychart after 1 week.  - Your provider will provide you with explanation and instruction on all relevant material in your results. Please keep in mind, results and labs may appear confusing or abnormal to the untrained eye, but it does not mean they are actually abnormal for you personally. If you have any questions about your results that are not covered, or you desire more detailed explanation than what was provided, you should make an appointment with your provider to do so.   Our office handles many outgoing and incoming calls daily. If we have not contacted you within 1 week about your results, please check your mychart to see if there is a message first and if not, then contact our office.  In helping with this matter, you help decrease call volume, and therefore allow us to be able to respond to  patients needs more efficiently.   Acute office visits (sick visit):  An acute visit is intended for a new problem and are scheduled in shorter time slots to allow schedule openings for patients with new problems. This is the appropriate visit to discuss a new problem. In order to provide you with excellent quality medical care with proper time for you to explain your problem, have an exam and receive treatment with instructions, these appointments should be limited to one new problem per visit. If you experience a new problem, in which you desire  to be addressed, please make an acute office visit, we save openings on the schedule to accommodate you. Please do not save your new problem for any other type of visit, let us take care of it properly and quickly for you.   Follow up visits:  Depending on your condition(s) your provider will need to see you routinely in order to provide you with quality care and prescribe medication(s). Most chronic conditions (Example: hypertension, Diabetes, depression/anxiety... etc), require visits a couple times a year. Your provider will instruct you on proper follow up for your personal medical conditions and history. Please make certain to make follow up appointments for your condition as instructed. Failing to do so could result in lapse in your medication treatment/refills. If you request a refill, and are overdue to be seen on a condition, we will always provide you with a 30 day script (once) to allow you time to schedule.    Medicare wellness (well visit): - we have a wonderful Nurse Selena Batten), that will meet with you and provide you will yearly medicare wellness visits. These visits should occur yearly (can not be scheduled less than 1 calendar year apart) and cover preventive health, immunizations, advance directives and screenings you are entitled to yearly through your medicare benefits. Do not miss out on your entitled benefits, this is when medicare will pay for these benefits to be ordered for you.  These are strongly encouraged by your provider and is the appropriate type of visit to make certain you are up to date with all preventive health benefits. If you have not had your medicare wellness exam in the last 12 months, please make certain to schedule one by calling the office and schedule your medicare wellness with Selena Batten as soon as possible.   Yearly physical (well visit):  - Adults are recommended to be seen yearly for physicals. Check with your insurance and date of your last physical, most insurances  require one calendar year between physicals. Physicals include all preventive health topics, screenings, medical exam and labs that are appropriate for gender/age and history. You may have fasting labs needed at this visit. This is a well visit (not a sick visit), new problems should not be covered during this visit (see acute visit).  - Pediatric patients are seen more frequently when they are younger. Your provider will advise you on well child visit timing that is appropriate for your their age. - This is not a medicare wellness visit. Medicare wellness exams do not have an exam portion to the visit. Some medicare companies allow for a physical, some do not allow a yearly physical. If your medicare allows a yearly physical you can schedule the medicare wellness with our nurse Selena Batten and have your physical with your provider after, on the same day. Please check with insurance for your full benefits.   Late Policy/No Shows:  - all new patients should arrive 15-30 minutes earlier than appointment  to allow [place] time  to  obtain all personal demographics,  insurance information and for you to complete office paperwork. - All established patients should arrive 10-15 minutes earlier than appointment time to update all information and be checked in .  - In our best efforts to run on time, if you are late for your appointment you will be asked to either reschedule or if able, we will work you back into the schedule. There will be a wait time to work you back in the schedule,  depending on availability.  - If you are unable to make it to your appointment as scheduled, please call 24 hours ahead of time to allow [place] to fill the time slot with someone else who needs to be seen. If you do not cancel your appointment ahead of time, you may be charged a no show fee.

## 2017-04-27 ENCOUNTER — Other Ambulatory Visit: Payer: Self-pay | Admitting: *Deleted

## 2017-04-27 MED ORDER — ESCITALOPRAM OXALATE 10 MG PO TABS: 10.0000 mg | ORAL_TABLET | Freq: Every day | ORAL | 0 refills | Status: DC

## 2017-05-02 ENCOUNTER — Encounter: Payer: Self-pay | Admitting: Family Medicine

## 2017-05-02 ENCOUNTER — Ambulatory Visit (INDEPENDENT_AMBULATORY_CARE_PROVIDER_SITE_OTHER): Payer: 59 | Admitting: Family Medicine

## 2017-05-02 VITALS — BP 137/81 | HR 69 | Temp 98.4°F | Ht 63.0 in | Wt 203.0 lb

## 2017-05-02 DIAGNOSIS — I10 Essential (primary) hypertension: Secondary | ICD-10-CM | POA: Diagnosis not present

## 2017-05-02 DIAGNOSIS — F418 Other specified anxiety disorders: Secondary | ICD-10-CM | POA: Diagnosis not present

## 2017-05-02 DIAGNOSIS — E782 Mixed hyperlipidemia: Secondary | ICD-10-CM

## 2017-05-02 MED ORDER — ESCITALOPRAM OXALATE 10 MG PO TABS: 10.0000 mg | ORAL_TABLET | Freq: Every day | ORAL | 1 refills | Status: DC

## 2017-05-02 MED ORDER — AMLODIPINE BESYLATE 5 MG PO TABS: 7.5000 mg | ORAL_TABLET | Freq: Every day | ORAL | 1 refills | Status: DC

## 2017-05-02 NOTE — Progress Notes (Signed)
Patient ID: Maria [name], female  DOB: Jun 25, 1970, 46 y.o.   MRN: [id number] Patient Care Team    Relationship Specialty Notifications Start End  Natalia Leatherwood, DO PCP - General Family Medicine  03/31/17   Erle Crocker, MD Consulting Physician Glaucoma Ophthalmology  03/31/17   Jerrell Mylar, MD Consulting Physician Gynecology  03/31/17     Chief Complaint  Patient presents with  . Depression  . Anxiety    Subjective:  Maria [name] is a 46 y.o.  female present for follow up  Depression with anxiety: Pt feels much improved on lexapro 10 mg QD. Her husband is also at a good point with his health and she feels her well being is also because of his wellbeing at the moment. She feels she is dealing with stress better and would like to remain on this dose for now.   Prior note:  Patient reports worsening depression with anxiety over the last few months. Her husband has been rather ill and now the hospital with multiple surgeries and she is starting to feel overwhelmed. She feels she has increased emotions, sadness and is just not as happy. She had been on Lexapro many years ago short-term and seemed to do okay with medication. She does not recall the dose that she had been on. She is very adamant she doesn't want to be on a medication where she will gain weight. She is seeing a therapist, started there 3 weeks ago and they are chronic continue to see each other 2 times a week. She does feel this is very helpful for her.  Hypertension/hyperlipidemia: Pt reports compliance with amlodipine 5 mg daily. Blood pressures ranges at home routinely monitored and about the same as in office today.Patient denies chest pain, shortness of breath, dizziness or lower extremity edema.  Pt does not take a daily baby ASA. Pt is not prescribed statin. RF: Hypertension, hyperlipidemia, obesity, Fhx heart disease   Mood disorder screening today negative  Depression screen [facility] 2/9 05/02/2017  03/31/2017 10/26/2016 03/15/2016  Decreased Interest 0 2 1 0  Down, Depressed, Hopeless 0 2 1 0  PHQ - 2 Score 0 4 2 0  Altered sleeping 0 1 1 -  Tired, decreased energy 0 2 1 -  Change in appetite 0 2 1 -  Feeling bad or failure about yourself  0 2 0 -  Trouble concentrating 0 2 1 -  Moving slowly or fidgety/restless 0 2 0 -  Suicidal thoughts 0 0 0 -  PHQ-9 Score 0 15 6 -  Difficult doing work/chores - Very difficult - -   GAD 7 : Generalized Anxiety Score 05/02/2017 03/31/2017  Nervous, Anxious, on Edge 0 2  Control/stop worrying 0 2  Worry too much - different things 0 2  Trouble relaxing 0 2  Restless 0 2  Easily annoyed or irritable 0 2  Afraid - awful might happen 2 3  Total GAD 7 Score 2 15  Anxiety Difficulty Somewhat difficult Very difficult       Fall Risk  03/31/2017  Falls in the past year? No     Immunization History  Administered Date(s) Administered  . Influenza Split 04/17/2012  . Influenza,inj,Quad PF,6+ Mos 03/28/2014, 01/26/2016  . Influenza-Unspecified 03/16/2017  . Tdap 04/30/2014   No exam data present  Past Medical History:  Diagnosis Date  . Depression with anxiety   . 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 History of thyroid  nodule 2011  . Hypertension   . Vaginal prolapse 2017   Allergies  Allergen Reactions  . Oxycodone-Acetaminophen Anaphylaxis  . Asa [Aspirin] Nausea And Vomiting   Past Surgical History:  Procedure Laterality Date  . ABDOMINAL HYSTERECTOMY  10/2015  . OOPHORECTOMY Right 07/23/15   Dr Huntley Decomlin   Family History  Problem Relation Age of Onset  . Hyperlipidemia Maternal Grandmother   . Hypertension Maternal Grandmother   . Diabetes Maternal Grandfather   . Psoriasis Maternal Grandfather   . Heart disease Maternal Grandfather   . Hyperlipidemia Maternal Grandfather   . Hypertension Maternal Grandfather   . Heart disease Paternal Grandfather   . Breast cancer Mother   . Prostate cancer Father    Social  History   Socioeconomic History  . Marital status: Married    Spouse name: Not on file  . Number of children: 2  . Years of education: 9514  . Highest education level: Not on file  Social Needs  . Financial resource strain: Not on file  . Food insecurity - worry: Not on file  . Food insecurity - inability: Not on file  . Transportation needs - medical: Not on file  . Transportation needs - non-medical: Not on file  Occupational History  . Occupation: Engineer, building servicesBenefits consultant  Tobacco Use  . Smoking status: Never Smoker  . Smokeless tobacco: Never Used  Substance and Sexual Activity  . Alcohol use: No  . Drug use: No  . Sexual activity: Yes    Partners: Male  Other Topics Concern  . Not on file  Social History Narrative   Married with 2 children.   Smoke alarm in the home, wears her seatbelt.   Regular exercise:  Walks once a week   Caffeine use:  1-2 cups coffee daily   She works as a Agricultural engineerbenefit consultant.    She has an associates degree.   Enjoys crafting/crocheting/[place]ar   Feels safe in her relationships.   Allergies as of 05/02/2017      Reactions   Oxycodone-acetaminophen Anaphylaxis   Asa [aspirin] Nausea And Vomiting      Medication List        Accurate as of 05/02/17  9:14 AM. Always use your most recent med list.          amLODipine 5 MG tablet Commonly known as:  NORVASC Take 1.5 tablets (7.5 mg total) by mouth daily.   escitalopram 10 MG tablet Commonly known as:  LEXAPRO Take 1 tablet (10 mg total) by mouth daily. Needs office visit prior to anymore refills   estradiol 0.025 MG/24HR Commonly known as:  VIVELLE-DOT PLACE 1 PATCH ONTO THE SKIN TWICE A WEEK.   meloxicam 7.5 MG tablet Commonly known as:  MOBIC Take 1 tablet (7.5 mg total) by mouth daily.   TURMERIC PO Take by mouth.       All past medical history, surgical history, allergies, family history, immunizations andmedications were updated in the EMR today and reviewed under the history  and medication portions of their EMR.    Recent Results (from the past 2160 hour(s))  CBC w/Diff     Status: None   Collection Time: 03/31/17  9:09 AM  Result Value Ref Range   WBC 7.2 4.0 - 10.5 K/uL   RBC 4.87 3.87 - 5.11 Mil/uL   Hemoglobin 14.7 12.0 - 15.0 g/dL   HCT 60.443.9 54.036.0 - 98.146.0 %   MCV 90.2 78.0 - 100.0 fl   MCHC 33.5 30.0 - 36.0  g/dL   RDW 16.112.5 09.611.5 - 04.515.5 %   Platelets 312.0 150.0 - 400.0 K/uL   Neutrophils Relative % 61.0 43.0 - 77.0 %   Lymphocytes Relative 29.4 12.0 - 46.0 %   Monocytes Relative 6.1 3.0 - 12.0 %   Eosinophils Relative 2.5 0.0 - 5.0 %   Basophils Relative 1.0 0.0 - 3.0 %   Neutro Abs 4.4 1.4 - 7.7 K/uL   Lymphs Abs 2.1 0.7 - 4.0 K/uL   Monocytes Absolute 0.4 0.1 - 1.0 K/uL   Eosinophils Absolute 0.2 0.0 - 0.7 K/uL   Basophils Absolute 0.1 0.0 - 0.1 K/uL  TSH     Status: None   Collection Time: 03/31/17  9:09 AM  Result Value Ref Range   TSH 2.25 0.35 - 4.50 uIU/mL  T3, free     Status: None   Collection Time: 03/31/17  9:09 AM  Result Value Ref Range   T3, Free 3.6 2.3 - 4.2 pg/mL  T4, free     Status: None   Collection Time: 03/31/17  9:09 AM  Result Value Ref Range   Free T4 0.77 0.60 - 1.60 ng/dL    Comment: Specimens from patients who are undergoing biotin therapy and /or ingesting biotin supplements may contain high levels of biotin.  The higher biotin concentration in these specimens interferes with this Free T4 assay.  Specimens that contain high levels  of biotin may cause false high results for this Free T4 assay.  Please interpret results in light of the total clinical presentation of the patient.      Mm Digital Screening  Result Date: 10/26/2016 CLINICAL DATA:  Screening. EXAM: DIGITAL SCREENING BILATERAL MAMMOGRAM WITH CAD COMPARISON:  Previous exam(s). ACR Breast Density Category b: There are scattered areas of fibroglandular density. FINDINGS: There are no findings suspicious for malignancy. Images were processed with CAD.  IMPRESSION: No mammographic evidence of malignancy. A result letter of this screening mammogram will be mailed directly to the patient. RECOMMENDATION: Screening mammogram in one year. (Code:SM-B-01Y) BI-RADS CATEGORY  1: Negative. Electronically Signed   By: Bary RichardStan  Maynard M.D.   On: 10/26/2016 15:53     ROS: 14 pt review of systems performed and negative (unless mentioned in an HPI)  Objective: BP 137/81 (BP Location: Right Arm, Patient Position: Sitting, Cuff Size: Large)   Pulse 69   Temp 98.4 F (36.9 C)   Ht 5\' 3"  (1.6 m)   Wt 203 lb (92.1 kg)   LMP 05/28/2015 (Exact Date)   SpO2 98%   BMI 35.96 kg/m  Gen: Afebrile. No acute distress.  HENT: AT. [place].MMM.  Eyes:Pupils Equal Round Reactive to light, Extraocular movements intact,  Conjunctiva without redness, discharge or icterus. CV: RRR no murmur, no edema, +2/4 P posterior tibialis pulses Chest: CTAB, no wheeze or crackles Abd: Soft. NTND. BS present.  Neuro:  Normal gait. PERLA. EOMi. Alert. Oriented x3 Psych: Normal affect, dress and demeanor. Normal speech. Normal thought content and judgment.    Assessment/plan: Kipp BroodDeborah Llamas is a 46 y.o. female present for establishing care with a new physician within the same group. Essential hypertension/Morbid obesity/hyperlipidemia - Borderline, better control needed. Increased amlodipine to 7.5 mg QD.  - refills provided today.  She will monitor her blood pressures over the next 4 weeks, and if continues to be borderline at home and on next visit, will increase her amlodipine. - lipid panel collected today.  - low sodium and exercise.  - f/u 6 months.  Depression with anxiety - Doing well on Lexapro 10 mg Qd. PHQ greatly improved.  - refills provided today. Pt will call in if needing increase, will agree to increase over the phone, but will need 4 week follow up after increase.  - F/U 6 months, sooner if needed.    Return in about 6 months (around 10/31/2017).   Note is  dictated utilizing voice recognition software. Although note has been proof read prior to signing, occasional typographical errors still can be missed. If any questions arise, please do not hesitate to call for verification.  Electronically signed by: Felix Pacini, DO [facility] [place]

## 2017-05-02 NOTE — Patient Instructions (Addendum)
Increase amlodipine to 7.5 mg a day (1.5 tabs). I have called in the refills for this medication.   I have refilled your lexapro today. I am glad you are BOTH doing well.   I hope you have happy holidays.    Follow up 6 months.   Please help us help you:  [facility] are [facility]. Below you will find basic instructions that you may need to access in the future. Please help us help you by reading the instructions, which cover many of the frequent questions we experience.   Prescription refills and request:  -In order to allow more efficient response time, please call your pharmacy for all refills. They will forward the request electronically to us. This allows for the quickest possible response. Request left on a nurse line can take longer to refill, since these are checked as time allows between office patients and other phone calls.  - refill request can take up to 3-5 working days to complete.  - If request is sent electronically and request is appropiate, it is usually completed in 1-2 business days.  - all patients will need to be seen routinely for all chronic medical conditions requiring prescription medications (see follow-up below). If you are overdue for follow up on your condition, you will be asked to make an appointment and we will call in enough medication to cover you until your appointment (up to 30 days).  - all controlled substances will require a face to face visit to request/refill.  - if you desire your prescriptions to go through a new pharmacy, and have an active script at original pharmacy, you will need to call your pharmacy and have scripts transferred to new pharmacy. This is completed between the pharmacy locations and not by your provider.    Results: If any images or labs were ordered, it can take up to 1 week to get results depending on the test ordered and the lab/facility running and resulting the test. - Normal  or stable results, which do not need further discussion, may be released to your mychart immediately with attached note to you. A call may not be generated for normal results. Please make certain to sign up for mychart. If you have questions on how to activate your mychart you can call the front office.  - If your results need further discussion, our office will attempt to contact you via phone, and if unable to reach you after 2 attempts, we will release your abnormal result to your mychart with instructions.  - All results will be automatically released in mychart after 1 week.  - Your provider will provide you with explanation and instruction on all relevant material in your results. Please keep in mind, results and labs may appear confusing or abnormal to the untrained eye, but it does not mean they are actually abnormal for you personally. If you have any questions about your results that are not covered, or you desire more detailed explanation than what was provided, you should make an appointment with your provider to do so.   Our office handles many outgoing and incoming calls daily. If we have not contacted you within 1 week about your results, please check your mychart to see if there is a message first and if not, then contact our office.  In helping with this matter, you help decrease call volume, and therefore allow us to be able to respond to patients needs  more efficiently.   Acute office visits (sick visit):  An acute visit is intended for a new problem and are scheduled in shorter time slots to allow schedule openings for patients with new problems. This is the appropriate visit to discuss a new problem. In order to provide you with excellent quality medical care with proper time for you to explain your problem, have an exam and receive treatment with instructions, these appointments should be limited to one new problem per visit. If you experience a new problem, in which you desire to be  addressed, please make an acute office visit, we save openings on the schedule to accommodate you. Please do not save your new problem for any other type of visit, let us take care of it properly and quickly for you.   Follow up visits:  Depending on your condition(s) your provider will need to see you routinely in order to provide you with quality care and prescribe medication(s). Most chronic conditions (Example: hypertension, Diabetes, depression/anxiety... etc), require visits a couple times a year. Your provider will instruct you on proper follow up for your personal medical conditions and history. Please make certain to make follow up appointments for your condition as instructed. Failing to do so could result in lapse in your medication treatment/refills. If you request a refill, and are overdue to be seen on a condition, we will always provide you with a 30 day script (once) to allow you time to schedule.    Medicare wellness (well visit): - we have a wonderful Nurse Selena Batten(Kim), that will meet with you and provide you will yearly medicare wellness visits. These visits should occur yearly (can not be scheduled less than 1 calendar year apart) and cover preventive health, immunizations, advance directives and screenings you are entitled to yearly through your medicare benefits. Do not miss out on your entitled benefits, this is when medicare will pay for these benefits to be ordered for you.  These are strongly encouraged by your provider and is the appropriate type of visit to make certain you are up to date with all preventive health benefits. If you have not had your medicare wellness exam in the last 12 months, please make certain to schedule one by calling the office and schedule your medicare wellness with Selena BattenKim as soon as possible.   Yearly physical (well visit):  - Adults are recommended to be seen yearly for physicals. Check with your insurance and date of your last physical, most insurances  require one calendar year between physicals. Physicals include all preventive health topics, screenings, medical exam and labs that are appropriate for gender/age and history. You may have fasting labs needed at this visit. This is a well visit (not a sick visit), new problems should not be covered during this visit (see acute visit).  - Pediatric patients are seen more frequently when they are younger. Your provider will advise you on well child visit timing that is appropriate for your their age. - This is not a medicare wellness visit. Medicare wellness exams do not have an exam portion to the visit. Some medicare companies allow for a physical, some do not allow a yearly physical. If your medicare allows a yearly physical you can schedule the medicare wellness with our nurse Selena BattenKim and have your physical with your provider after, on the same day. Please check with insurance for your full benefits.   Late Policy/No Shows:  - all new patients should arrive 15-30 minutes earlier than appointment to allow  [place] time  to  obtain all personal demographics,  insurance information and for you to complete office paperwork. - All established patients should arrive 10-15 minutes earlier than appointment time to update all information and be checked in .  - In our best efforts to run on time, if you are late for your appointment you will be asked to either reschedule or if able, we will work you back into the schedule. There will be a wait time to work you back in the schedule,  depending on availability.  - If you are unable to make it to your appointment as scheduled, please call 24 hours ahead of time to allow [place] to fill the time slot with someone else who needs to be seen. If you do not cancel your appointment ahead of time, you may be charged a no show fee.

## 2017-05-03 LAB — LIPID PANEL
CHOLESTEROL: 205 mg/dL — AB (ref 0–200)
HDL: 50.4 mg/dL (ref >39.00)
LDL CALC: 123 mg/dL — AB (ref 0–99)
NonHDL: 154.91
TRIGLYCERIDES: 159 mg/dL — AB (ref 0.0–149.0)
Total CHOL/HDL Ratio: 4
VLDL: 31.8 mg/dL (ref 0.0–40.0)

## 2017-06-18 ENCOUNTER — Other Ambulatory Visit: Payer: Self-pay | Admitting: Family

## 2017-11-07 ENCOUNTER — Other Ambulatory Visit: Payer: Self-pay

## 2017-11-07 MED ORDER — ESCITALOPRAM OXALATE 10 MG PO TABS: 10.0000 mg | ORAL_TABLET | Freq: Every day | ORAL | 0 refills | Status: DC

## 2017-12-13 ENCOUNTER — Other Ambulatory Visit: Payer: Self-pay | Admitting: Family Medicine

## 2017-12-13 DIAGNOSIS — Z1231 Encounter for screening mammogram for malignant neoplasm of breast: Secondary | ICD-10-CM

## 2017-12-16 ENCOUNTER — Ambulatory Visit (HOSPITAL_BASED_OUTPATIENT_CLINIC_OR_DEPARTMENT_OTHER)
Admission: RE | Admit: 2017-12-16 | Discharge: 2017-12-16 | Disposition: A | Discharge status: Home / Self Care | Payer: 59 | Source: Ambulatory Visit | Attending: Family Medicine | Admitting: Family Medicine

## 2017-12-16 DIAGNOSIS — Z1231 Encounter for screening mammogram for malignant neoplasm of breast: Secondary | ICD-10-CM | POA: Diagnosis present

## 2017-12-19 ENCOUNTER — Other Ambulatory Visit: Payer: Self-pay

## 2017-12-19 MED ORDER — ESCITALOPRAM OXALATE 10 MG PO TABS: 10.0000 mg | ORAL_TABLET | Freq: Every day | ORAL | 0 refills | Status: DC

## 2018-05-11 ENCOUNTER — Ambulatory Visit: Payer: 59 | Admitting: Family Medicine

## 2018-05-15 ENCOUNTER — Ambulatory Visit (INDEPENDENT_AMBULATORY_CARE_PROVIDER_SITE_OTHER): Payer: 59 | Admitting: Family Medicine

## 2018-05-15 ENCOUNTER — Encounter: Payer: Self-pay | Admitting: Family Medicine

## 2018-05-15 VITALS — BP 128/92 | HR 78 | Temp 98.3°F | Resp 16 | Ht 61.0 in | Wt 212.0 lb

## 2018-05-15 DIAGNOSIS — F418 Other specified anxiety disorders: Secondary | ICD-10-CM

## 2018-05-15 DIAGNOSIS — I10 Essential (primary) hypertension: Secondary | ICD-10-CM | POA: Diagnosis not present

## 2018-05-15 DIAGNOSIS — E782 Mixed hyperlipidemia: Secondary | ICD-10-CM

## 2018-05-15 DIAGNOSIS — M5410 Radiculopathy, site unspecified: Secondary | ICD-10-CM

## 2018-05-15 MED ORDER — ESCITALOPRAM OXALATE 10 MG PO TABS: 10.0000 mg | ORAL_TABLET | Freq: Every day | ORAL | 1 refills | Status: DC

## 2018-05-15 MED ORDER — AMLODIPINE BESYLATE 2.5 MG PO TABS: 2.5000 mg | ORAL_TABLET | Freq: Every day | ORAL | 1 refills | Status: DC

## 2018-05-15 MED ORDER — MELOXICAM 7.5 MG PO TABS: 7.5000 mg | ORAL_TABLET | Freq: Every day | ORAL | 3 refills | Status: DC

## 2018-05-15 NOTE — Patient Instructions (Addendum)
We will call you with lab results. Follow up in 6 months.   I have refilled your meds     Please help us help you:  We are [facility]. Below you will find basic instructions that you may need to access in the future. Please help us help you by reading the instructions, which cover many of the frequent questions we experience.   Prescription refills and request:  -In order to allow more efficient response time, please call your pharmacy for all refills. They will forward the request electronically to us. This allows for the quickest possible response. Request left on a nurse line can take longer to refill, since these are checked as time allows between office patients and other phone calls.  - refill request can take up to 3-5 working days to complete.  - If request is sent electronically and request is appropiate, it is usually completed in 1-2 business days.  - all patients will need to be seen routinely for all chronic medical conditions requiring prescription medications (see follow-up below). If you are overdue for follow up on your condition, you will be asked to make an appointment and we will call in enough medication to cover you until your appointment (up to 30 days).  - all controlled substances will require a face to face visit to request/refill.  - if you desire your prescriptions to go through a new pharmacy, and have an active script at original pharmacy, you will need to call your pharmacy and have scripts transferred to new pharmacy. This is completed between the pharmacy locations and not by your provider.    Results: If any images or labs were ordered, it can take up to 1 week to get results depending on the test ordered and the lab/facility running and resulting the test. - Normal or stable results, which do not need further discussion, may be released to your mychart immediately with attached note to you. A call may not  be generated for normal results. Please make certain to sign up for mychart. If you have questions on how to activate your mychart you can call the front office.  - If your results need further discussion, our office will attempt to contact you via phone, and if unable to reach you after 2 attempts, we will release your abnormal result to your mychart with instructions.  - All results will be automatically released in mychart after 1 week.  - Your provider will provide you with explanation and instruction on all relevant material in your results. Please keep in mind, results and labs may appear confusing or abnormal to the untrained eye, but it does not mean they are actually abnormal for you personally. If you have any questions about your results that are not covered, or you desire more detailed explanation than what was provided, you should make an appointment with your provider to do so.   Our office handles many outgoing and incoming calls daily. If we have not contacted you within 1 week about your results, please check your mychart to see if there is a message first and if not, then contact our office.  In helping with this matter, you help decrease call volume, and therefore allow us to be able to respond to patients needs more efficiently.   Acute office visits (sick visit):  An acute visit is intended for a new problem and are scheduled in shorter time slots to allow  schedule openings for patients with new problems. This is the appropriate visit to discuss a new problem. Problems will not be addressed by phone call or Echart message. Appointment is needed if requesting treatment. In order to provide you with excellent quality medical care with proper time for you to explain your problem, have an exam and receive treatment with instructions, these appointments should be limited to one new problem per visit. If you experience a new problem, in which you desire to be addressed, please make an acute  office visit, we save openings on the schedule to accommodate you. Please do not save your new problem for any other type of visit, let us take care of it properly and quickly for you.   Follow up visits:  Depending on your condition(s) your provider will need to see you routinely in order to provide you with quality care and prescribe medication(s). Most chronic conditions (Example: hypertension, Diabetes, depression/anxiety... etc), require visits a couple times a year. Your provider will instruct you on proper follow up for your personal medical conditions and history. Please make certain to make follow up appointments for your condition as instructed. Failing to do so could result in lapse in your medication treatment/refills. If you request a refill, and are overdue to be seen on a condition, we will always provide you with a 30 day script (once) to allow you time to schedule.    Medicare wellness (well visit): - we have a wonderful Nurse Maudie Mercury), that will meet with you and provide you will yearly medicare wellness visits. These visits should occur yearly (can not be scheduled less than 1 calendar year apart) and cover preventive health, immunizations, advance directives and screenings you are entitled to yearly through your medicare benefits. Do not miss out on your entitled benefits, this is when medicare will pay for these benefits to be ordered for you.  These are strongly encouraged by your provider and is the appropriate type of visit to make certain you are up to date with all preventive health benefits. If you have not had your medicare wellness exam in the last 12 months, please make certain to schedule one by calling the office and schedule your medicare wellness with Maudie Mercury as soon as possible.   Yearly physical (well visit):  - Adults are recommended to be seen yearly for physicals. Check with your insurance and date of your last physical, most insurances require one calendar year between  physicals. Physicals include all preventive health topics, screenings, medical exam and labs that are appropriate for gender/age and history. You may have fasting labs needed at this visit. This is a well visit (not a sick visit), new problems should not be covered during this visit (see acute visit).  - Pediatric patients are seen more frequently when they are younger. Your provider will advise you on well child visit timing that is appropriate for your their age. - This is not a medicare wellness visit. Medicare wellness exams do not have an exam portion to the visit. Some medicare companies allow for a physical, some do not allow a yearly physical. If your medicare allows a yearly physical you can schedule the medicare wellness with our nurse Maudie Mercury and have your physical with your provider after, on the same day. Please check with insurance for your full benefits.   Late Policy/No Shows:  - all new patients should arrive 15-30 minutes earlier than appointment to allow [place] time  to  obtain all personal demographics,  insurance  information and for you to complete office paperwork. - All established patients should arrive 10-15 minutes earlier than appointment time to update all information and be checked in .  - In our best efforts to run on time, if you are late for your appointment you will be asked to either reschedule or if able, we will work you back into the schedule. There will be a wait time to work you back in the schedule,  depending on availability.  - If you are unable to make it to your appointment as scheduled, please call 24 hours ahead of time to allow [place] to fill the time slot with someone else who needs to be seen. If you do not cancel your appointment ahead of time, you may be charged a no show fee.

## 2018-05-15 NOTE — Progress Notes (Signed)
Patient ID: Maria [name], female  DOB: Sep 19, 1970, 47 y.o.   MRN: [id number] Patient Care Team    Relationship Specialty Notifications Start End  Ma Hillock, DO PCP - General Family Medicine  03/31/17   Melchor Amour, MD Consulting Physician Glaucoma Ophthalmology  03/31/17   Marti Sleigh, MD Consulting Physician Gynecology  03/31/17     Chief Complaint  Patient presents with  . Follow-up    [facility]    Subjective:  Maria [name] is a 47 y.o.  female present for follow up  Depression with anxiety: Pt feels she was doing well on the lexapro, but she has not been taking it lately. Her husband is having difficulties and has been admitted after surgery. She feels she needs to get back on the medication.   Prior note:  Patient reports worsening depression with anxiety over the last few months. Her husband has been rather ill and now the hospital with multiple surgeries and she is starting to feel overwhelmed. She feels she has increased emotions, sadness and is just not as happy. She had been on Lexapro many years ago short-term and seemed to do okay with medication. She does not recall the dose that she had been on. She is very adamant she doesn't want to be on a medication where she will gain weight. She is seeing a therapist, started there 3 weeks ago and they are chronic continue to see each other 2 times a week. She does feel this is very helpful for her.  Hypertension/hyperlipidemia: Pt reports she has not taken the amlodipine 7.5 mg for many months. She was lost to follow up. Reports pharmacy just stopped filling her med.  Patient denies chest pain, shortness of breath, dizziness or lower extremity edema.  Pt does not take a daily baby ASA. Pt is not prescribed statin. RF: Hypertension, hyperlipidemia, obesity, Fhx heart disease  Low back pain- radiculopathy: Doing well on Mobic 7.5 mg Qd.    Mood disorder screening today negative  Depression screen Maria [name] 2/9  05/15/2018 05/02/2017 03/31/2017 10/26/2016 03/15/2016  Decreased Interest 1 0 2 1 0  Down, Depressed, Hopeless 1 0 2 1 0  PHQ - 2 Score 2 0 4 2 0  Altered sleeping 1 0 1 1 -  Tired, decreased energy 1 0 2 1 -  Change in appetite 1 0 2 1 -  Feeling bad or failure about yourself  1 0 2 0 -  Trouble concentrating 1 0 2 1 -  Moving slowly or fidgety/restless 2 0 2 0 -  Suicidal thoughts 0 0 0 0 -  PHQ-9 Score 9 0 15 6 -  Difficult doing work/chores Not difficult at all - Very difficult - -   GAD 7 : Generalized Anxiety Score 05/15/2018 05/02/2017 03/31/2017  Nervous, Anxious, on Edge 1 0 2  Control/stop worrying 1 0 2  Worry too much - different things 1 0 2  Trouble relaxing 1 0 2  Restless 1 0 2  Easily annoyed or irritable 1 0 2  Afraid - awful might happen '3 2 3  ' Total GAD 7 Score '9 2 15  ' Anxiety Difficulty Not difficult at all Somewhat difficult Very difficult       Fall Risk  03/31/2017  Falls in the past year? No     Immunization History  Administered Date(s) Administered  . Influenza Split 04/17/2012  . Influenza,inj,Quad PF,6+ Mos 03/28/2014, 01/26/2016  . Influenza-Unspecified 03/16/2017, 03/29/2018  . Tdap 04/30/2014  No exam data present  Past Medical History:  Diagnosis Date  . Depression with anxiety   . 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 History of thyroid nodule 2011  . Hypertension   . Vaginal prolapse 2017   Allergies  Allergen Reactions  . Oxycodone-Acetaminophen Anaphylaxis  . Asa [Aspirin] Nausea And Vomiting   Past Surgical History:  Procedure Laterality Date  . ABDOMINAL HYSTERECTOMY  10/2015  . OOPHORECTOMY Right 07/23/15   Dr Gertie Fey   Family History  Problem Relation Age of Onset  . Hyperlipidemia Maternal Grandmother   . Hypertension Maternal Grandmother   . Diabetes Maternal Grandfather   . Psoriasis Maternal Grandfather   . Heart disease Maternal Grandfather   . Hyperlipidemia Maternal Grandfather   . Hypertension  Maternal Grandfather   . Heart disease Paternal Grandfather   . Breast cancer Mother   . Prostate cancer Father    Social History   Socioeconomic History  . Marital status: Married    Spouse name: Not on file  . Number of children: 2  . Years of education: 62  . Highest education level: Not on file  Occupational History  . Occupation: Chief Operating Officer  Social Needs  . Financial resource strain: Not on file  . Food insecurity:    Worry: Not on file    Inability: Not on file  . Transportation needs:    Medical: Not on file    Non-medical: Not on file  Tobacco Use  . Smoking status: Never Smoker  . Smokeless tobacco: Never Used  Substance and Sexual Activity  . Alcohol use: No  . Drug use: No  . Sexual activity: Yes    Partners: Male  Lifestyle  . Physical activity:    Days per week: Not on file    Minutes per session: Not on file  . Stress: Not on file  Relationships  . Social connections:    Talks on phone: Not on file    Gets together: Not on file    Attends religious service: Not on file    Active member of club or organization: Not on file    Attends meetings of clubs or organizations: Not on file    Relationship status: Not on file  . Intimate partner violence:    Fear of current or ex partner: Not on file    Emotionally abused: Not on file    Physically abused: Not on file    Forced sexual activity: Not on file  Other Topics Concern  . Not on file  Social History Narrative   Married with 2 children.   Smoke alarm in the home, wears her seatbelt.   Regular exercise:  Walks once a week   Caffeine use:  1-2 cups coffee daily   She works as a Therapist, nutritional.    She has an associates degree.   Enjoys crafting/crocheting/Nascar   Feels safe in her relationships.   Allergies as of 05/15/2018      Reactions   Oxycodone-acetaminophen Anaphylaxis   Asa [aspirin] Nausea And Vomiting      Medication List       Accurate as of May 15, 2018  2:54  PM. Always use your most recent med list.        amLODipine 5 MG tablet Commonly known as:  NORVASC Take 1.5 tablets (7.5 mg total) by mouth daily.   amLODipine 5 MG tablet Commonly known as:  NORVASC TAKE 1 TABLET BY MOUTH EVERY DAY  escitalopram 10 MG tablet Commonly known as:  LEXAPRO Take 1 tablet (10 mg total) by mouth daily. Needs office visit prior to anymore refills**   estradiol 0.025 MG/24HR Commonly known as:  VIVELLE-DOT PLACE 1 PATCH ONTO THE SKIN TWICE A WEEK.   meloxicam 7.5 MG tablet Commonly known as:  MOBIC Take 1 tablet (7.5 mg total) by mouth daily.   TURMERIC PO Take by mouth.       All past medical history, surgical history, allergies, family history, immunizations andmedications were updated in the EMR today and reviewed under the history and medication portions of their EMR.    No results found for this or any previous visit (from the past 2160 hour(s)).   ROS: 14 pt review of systems performed and negative (unless mentioned in an HPI)  Objective: BP (!) 128/92 (BP Location: Left Arm, Patient Position: Sitting, Cuff Size: Large)   Pulse 78   Temp 98.3 F (36.8 C) (Oral)   Resp 16   Ht '5\' 1"'  (1.549 m)   Wt 212 lb (96.2 kg)   LMP 05/28/2015 (Exact Date)   SpO2 96%   BMI 40.06 kg/m  Gen: Afebrile. No acute distress. Nontoxic, obese female.  HENT: AT. [place].  MMM. Eyes:Pupils Equal Round Reactive to light, Extraocular movements intact,  Conjunctiva without redness, discharge or icterus. Neck/lymp/endocrine: Supple,no lymphadenopathy, no thyromegaly CV: RRR no murmur, no edema, +2/4 P posterior tibialis pulses Chest: CTAB, no wheeze or crackles Abd: Soft. NTND. BS present Skin: no rashes, purpura or petechiae.  Neuro:  Normal gait. PERLA. EOMi. Alert. Oriented. Psych: Normal affect, dress and demeanor. Normal speech. Normal thought content and judgment.  Assessment/plan: Nylan Nakatani is a 47 y.o. female present for establishing care with a  new physician within the same group. Essential hypertension/Morbid obesity/hyperlipidemia - elevated diastolic. Just mildly above goal considering she has not taken med.  - restart amlodipine at 2.5 mg QD (last prior dose 7.5 mg) - amLODipine (NORVASC) 2.5 MG tablet; Take 1 tablet (2.5 mg total) by mouth daily.  Dispense: 90 tablet; Refill: 1 - CBC - Comp Met (CMET) - TSH - Lipid panel F/u 6 mos  Depression with anxiety - restart lexapro.  - escitalopram (LEXAPRO) 10 MG tablet; Take 1 tablet (10 mg total) by mouth daily. Needs office visit prior to anymore refills**  Dispense: 90 tablet; Refill: 1 - TSH - f/u 6 mos  Radicular pain of lower extremity Stable. Refills provided.  - meloxicam (MOBIC) 7.5 MG tablet; Take 1 tablet (7.5 mg total) by mouth daily.  Dispense: 90 tablet; Refill: 3  No follow-ups on file.   Note is dictated utilizing voice recognition software. Although note has been proof read prior to signing, occasional typographical errors still can be missed. If any questions arise, please do not hesitate to call for verification.  Electronically signed by: Howard Pouch, DO [place]

## 2018-05-16 LAB — COMPREHENSIVE METABOLIC PANEL
ALT: 38 U/L — AB (ref 0–35)
AST: 21 U/L (ref 0–37)
Albumin: 4.5 g/dL (ref 3.5–5.2)
Alkaline Phosphatase: 74 U/L (ref 39–117)
BUN: 11 mg/dL (ref 6–23)
CHLORIDE: 103 meq/L (ref 96–112)
CO2: 29 meq/L (ref 19–32)
CREATININE: 0.67 mg/dL (ref 0.40–1.20)
Calcium: 9.5 mg/dL (ref 8.4–10.5)
GFR: 99.55 mL/min (ref >60.00)
GLUCOSE: 100 mg/dL — AB (ref 70–99)
POTASSIUM: 4.3 meq/L (ref 3.5–5.1)
Sodium: 140 mEq/L (ref 135–145)
TOTAL PROTEIN: 7.1 g/dL (ref 6.0–8.3)
Total Bilirubin: 0.4 mg/dL (ref 0.2–1.2)

## 2018-05-16 LAB — LIPID PANEL
CHOL/HDL RATIO: 5
Cholesterol: 197 mg/dL (ref 0–200)
HDL: 43.7 mg/dL (ref >39.00)
LDL CALC: 117 mg/dL — AB (ref 0–99)
NONHDL: 153.47
Triglycerides: 183 mg/dL — ABNORMAL HIGH (ref 0.0–149.0)
VLDL: 36.6 mg/dL (ref 0.0–40.0)

## 2018-05-16 LAB — CBC
HCT: 46 % (ref 36.0–46.0)
HEMOGLOBIN: 15.5 g/dL — AB (ref 12.0–15.0)
MCHC: 33.7 g/dL (ref 30.0–36.0)
MCV: 89.1 fl (ref 78.0–100.0)
Platelets: 294 10*3/uL (ref 150.0–400.0)
RBC: 5.16 Mil/uL — ABNORMAL HIGH (ref 3.87–5.11)
RDW: 12.8 % (ref 11.5–15.5)
WBC: 8.4 10*3/uL (ref 4.0–10.5)

## 2018-05-16 LAB — TSH: TSH: 3.33 u[IU]/mL (ref 0.35–4.50)

## 2018-05-17 ENCOUNTER — Telehealth: Payer: Self-pay | Admitting: Family Medicine

## 2018-05-17 NOTE — Telephone Encounter (Signed)
Please inform patient the following information: Labs overall look stable. -Did have a very mild elevation in one of her liver enzymes: ALT. This can be caused  By  fasting, certain medications, certain illnesses, elevated triglycerides or alcohol use.  This is just very mildly elevated and not concerning at this time, however we will repeat her labs at her next visit in 6 months to ensure it remains stable. -She also had a very mild elevation in her hemoglobin level, this also is very mildly elevated and not back concerning at this time but will repeat those levels also at her follow-up. -Would recommend she start fish oil supplementation of 1000 mg a day to help keep her triglycerides in normal range, they are very mildly elevated at 183-the rest of her cholesterol panel looks great.

## 2018-05-17 NOTE — Telephone Encounter (Signed)
She could try the plant based omega 3 supplements (they come in both fish based or plant based), red yeast rice supplement or we can prescribe a fiber based medication for her.

## 2018-05-17 NOTE — Telephone Encounter (Signed)
Sent pt a mychart message. 

## 2018-05-18 NOTE — Telephone Encounter (Signed)
Pt notified of recommendations and told that if she has trouble finding these then she is to call the office back.

## 2018-05-31 HISTORY — PX: BREAST BIOPSY: SHX20

## 2018-11-05 ENCOUNTER — Other Ambulatory Visit: Payer: Self-pay | Admitting: Family Medicine

## 2018-11-05 DIAGNOSIS — I10 Essential (primary) hypertension: Secondary | ICD-10-CM

## 2018-11-05 DIAGNOSIS — F418 Other specified anxiety disorders: Secondary | ICD-10-CM

## 2018-11-06 NOTE — Telephone Encounter (Signed)
Please call patient. I have received refill request and pt is due for 6 mos follow up. Please schedule ASAP.   

## 2018-11-06 NOTE — Telephone Encounter (Signed)
Pt was called and detailed message was left on patients VM, okay per DPR, letting her know she was due for appt and to call and get appt scheduled so there is no delay in getting refills.

## 2018-12-20 ENCOUNTER — Encounter: Payer: Self-pay | Admitting: Family Medicine

## 2018-12-20 NOTE — Telephone Encounter (Signed)
Patient requesting refill for estradiol patch. Previously prescribed by GYN. Patient advised PCP is out of office and she is due for f/u. 6 month f/u appt scheduled via DOXY.

## 2018-12-26 ENCOUNTER — Ambulatory Visit (INDEPENDENT_AMBULATORY_CARE_PROVIDER_SITE_OTHER): Payer: 59 | Admitting: Family Medicine

## 2018-12-26 ENCOUNTER — Encounter: Payer: Self-pay | Admitting: Family Medicine

## 2018-12-26 ENCOUNTER — Other Ambulatory Visit: Payer: Self-pay

## 2018-12-26 VITALS — BP 129/90 | HR 106 | Temp 98.3°F

## 2018-12-26 DIAGNOSIS — I10 Essential (primary) hypertension: Secondary | ICD-10-CM | POA: Diagnosis not present

## 2018-12-26 DIAGNOSIS — D582 Other hemoglobinopathies: Secondary | ICD-10-CM | POA: Insufficient documentation

## 2018-12-26 DIAGNOSIS — E782 Mixed hyperlipidemia: Secondary | ICD-10-CM | POA: Diagnosis not present

## 2018-12-26 DIAGNOSIS — M5410 Radiculopathy, site unspecified: Secondary | ICD-10-CM | POA: Insufficient documentation

## 2018-12-26 DIAGNOSIS — R7989 Other specified abnormal findings of blood chemistry: Secondary | ICD-10-CM | POA: Insufficient documentation

## 2018-12-26 DIAGNOSIS — R945 Abnormal results of liver function studies: Secondary | ICD-10-CM

## 2018-12-26 DIAGNOSIS — F418 Other specified anxiety disorders: Secondary | ICD-10-CM | POA: Diagnosis not present

## 2018-12-26 HISTORY — DX: Radiculopathy, site unspecified: M54.10

## 2018-12-26 MED ORDER — AMLODIPINE BESYLATE 2.5 MG PO TABS: 2.5000 mg | ORAL_TABLET | Freq: Every day | ORAL | 1 refills | Status: DC

## 2018-12-26 MED ORDER — ESTRADIOL 0.025 MG/24HR TD PTTW: MEDICATED_PATCH | TRANSDERMAL | 5 refills | Status: DC

## 2018-12-26 MED ORDER — MELOXICAM 7.5 MG PO TABS: 7.5000 mg | ORAL_TABLET | Freq: Every day | ORAL | 3 refills | Status: DC

## 2018-12-26 NOTE — Progress Notes (Signed)
VIRTUAL VISIT VIA VIDEO  I connected with Maria [name] on 12/26/18 at  1:00 PM EDT by a video enabled telemedicine application and verified that I am speaking with the correct person using two identifiers. Location patient: Home Location provider: [facility], Office Persons participating in the virtual visit: Patient, Dr. Raoul Pitch and R.Baker, LPN  I discussed the limitations of evaluation and management by telemedicine and the availability of in person appointments. The patient expressed understanding and agreed to proceed.      Patient ID: Maria [name], female  DOB: Dec 17, 1970, 48 y.o.   MRN: [id number] Patient Care Team    Relationship Specialty Notifications Start End  Ma Hillock, DO PCP - General Family Medicine  03/31/17   Melchor Amour, MD Consulting Physician Glaucoma Ophthalmology  03/31/17   Marti Sleigh, MD Consulting Physician Gynecology  03/31/17     Chief Complaint  Patient presents with  . Anxiety    patient doesnt have any conerns  . Depression  . Hypertension    129/90. took medication this morning. does not check bp daily.    Subjective:  Maria [name] is a 48 y.o.  female present for follow up Atrophic vaginitis/postmeopausal: Patient reports she has been on estrogen patches twice weekly since 2017 when she had her total hysterectomy/BSO for endometriosis and ovarian cyst rupture.  She states she never had diagnosis of cancer.  She had her last mammogram July 2019 and it was normal.  She has a family history of breast cancer in her mother (age 46).  She performs self breast exams.  She endorses mild vaginal dryness.  She denies any history of hot flashes or other menopausal symptoms.  Depression with anxiety: Has not been taking her lexapro- feels she is doing well without.   Hypertension/hyperlipidemia: Pt reports compliance with amlodipine 2.5 mg daily. Blood pressures ranges at home within normal limits. Patient denies chest pain,  shortness of breath or lower extremity edema.  Exercise: Routine exercise RF: Hypertension, hyperlipidemia, obesity, Fhx heart disease  Low back pain- radiculopathy: Doing well  on Mobic 7.5 mg Qd.   Tachycardia/elevated hemoglobin/elevated liver enzymes: Patient's pulses elevated today above her normal.  She also has an elevated hemoglobin (mild) and a mild elevation in her liver enzymes 6 months ago.  She is not a smoker.   Depression screen [facility] 2/9 12/26/2018 12/26/2018 05/15/2018 05/02/2017 03/31/2017  Decreased Interest 0 0 1 0 2  Down, Depressed, Hopeless 0 0 1 0 2  PHQ - 2 Score 0 0 2 0 4  Altered sleeping 0 - 1 0 1  Tired, decreased energy 0 - 1 0 2  Change in appetite 0 - 1 0 2  Feeling bad or failure about yourself  0 - 1 0 2  Trouble concentrating 1 - 1 0 2  Moving slowly or fidgety/restless 0 - 2 0 2  Suicidal thoughts 0 - 0 0 0  PHQ-9 Score 1 - 9 0 15  Difficult doing work/chores Not difficult at all - Not difficult at all - Very difficult   GAD 7 : Generalized Anxiety Score 12/26/2018 05/15/2018 05/02/2017 03/31/2017  Nervous, Anxious, on Edge 0 1 0 2  Control/stop worrying 0 1 0 2  Worry too much - different things 0 1 0 2  Trouble relaxing 1 1 0 2  Restless 0 1 0 2  Easily annoyed or irritable 0 1 0 2  Afraid - awful might happen 0 3 2 3  Total GAD 7 Score '1 9 2 15  ' Anxiety Difficulty Not difficult at all Not difficult at all Somewhat difficult Very difficult       Fall Risk  03/31/2017  Falls in the past year? No     Immunization History  Administered Date(s) Administered  . Influenza Split 04/17/2012  . Influenza,inj,Quad PF,6+ Mos 03/28/2014, 01/26/2016  . Influenza-Unspecified 03/16/2017, 03/29/2018  . Tdap 04/30/2014   No exam data present  Past Medical History:  Diagnosis Date  . Depression with anxiety   . 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 History of thyroid nodule 2011  . Hypertension   . Vaginal prolapse 2017   Allergies   Allergen Reactions  . Oxycodone-Acetaminophen Anaphylaxis  . Asa [Aspirin] Nausea And Vomiting   Past Surgical History:  Procedure Laterality Date  . ABDOMINAL HYSTERECTOMY  10/2015  . OOPHORECTOMY Right 07/23/15   Dr Gertie Fey   Family History  Problem Relation Age of Onset  . Hyperlipidemia Maternal Grandmother   . Hypertension Maternal Grandmother   . Diabetes Maternal Grandfather   . Psoriasis Maternal Grandfather   . Heart disease Maternal Grandfather   . Hyperlipidemia Maternal Grandfather   . Hypertension Maternal Grandfather   . Heart disease Paternal Grandfather   . Breast cancer Mother   . Prostate cancer Father    Social History   Socioeconomic History  . Marital status: Married    Spouse name: Not on file  . Number of children: 2  . Years of education: 29  . Highest education level: Not on file  Occupational History  . Occupation: Chief Operating Officer  Social Needs  . Financial resource strain: Not on file  . Food insecurity    Worry: Not on file    Inability: Not on file  . Transportation needs    Medical: Not on file    Non-medical: Not on file  Tobacco Use  . Smoking status: Never Smoker  . Smokeless tobacco: Never Used  Substance and Sexual Activity  . Alcohol use: No  . Drug use: No  . Sexual activity: Yes    Partners: Male  Lifestyle  . Physical activity    Days per week: Not on file    Minutes per session: Not on file  . Stress: Not on file  Relationships  . Social Herbalist on phone: Not on file    Gets together: Not on file    Attends religious service: Not on file    Active member of club or organization: Not on file    Attends meetings of clubs or organizations: Not on file    Relationship status: Not on file  . Intimate partner violence    Fear of current or ex partner: Not on file    Emotionally abused: Not on file    Physically abused: Not on file    Forced sexual activity: Not on file  Other Topics Concern  . Not on  file  Social History Narrative   Married with 2 children.   Smoke alarm in the home, wears her seatbelt.   Regular exercise:  Walks once a week   Caffeine use:  1-2 cups coffee daily   She works as a Therapist, nutritional.    She has an associates degree.   Enjoys crafting/crocheting/Nascar   Feels safe in her relationships.   Allergies as of 12/26/2018      Reactions   Oxycodone-acetaminophen Anaphylaxis   Asa [aspirin] Nausea And Vomiting  Medication List       Accurate as of December 26, 2018  1:12 PM. If you have any questions, ask your nurse or doctor.        amLODipine 2.5 MG tablet Commonly known as: NORVASC Take 1 tablet (2.5 mg total) by mouth daily.   escitalopram 10 MG tablet Commonly known as: LEXAPRO Take 1 tablet (10 mg total) by mouth daily. Needs office visit prior to anymore refills**   estradiol 0.025 MG/24HR Commonly known as: VIVELLE-DOT PLACE 1 PATCH ONTO THE SKIN TWICE A WEEK.   meloxicam 7.5 MG tablet Commonly known as: MOBIC Take 1 tablet (7.5 mg total) by mouth daily.   RED YEAST RICE PO Take by mouth 2 (two) times a day.   TURMERIC PO Take by mouth.       All past medical history, surgical history, allergies, family history, immunizations andmedications were updated in the EMR today and reviewed under the history and medication portions of their EMR.    No results found for this or any previous visit (from the past 2160 hour(s)).   ROS: 14 pt review of systems performed and negative (unless mentioned in an HPI)  Objective: BP 129/90   Pulse (!) 106   Temp 98.3 F (36.8 C) (Temporal)   LMP 05/28/2015 (Exact Date)  Gen: Afebrile. No acute distress. Obese caucasian female.  HENT: AT. [place]. MMM.  Eyes:Pupils Equal Round Reactive to light, Extraocular movements intact,  Conjunctiva without redness, discharge or icterus. CV:  No edema Chest: no cough or shortness of breath Skin: no rashes, purpura or petechiae.  Neuro: Normal gait.  PERLA. EOMi. Alert. Oriented.  Psych: Normal affect, dress and demeanor. Normal speech. Normal thought content and judgment.   Assessment/plan: Maria [name] is a 48 y.o. female present for establishing care with a new physician within the same group. Essential hypertension/Morbid obesity/hyperlipidemia - continue  amlodipine at 2.5 mg QD (last prior dose 7.5 mg) - amLODipine (NORVASC) 2.5 MG tablet; Take 1 tablet (2.5 mg total) by mouth daily.  Dispense: 90 tablet; Refill: 1 - CBC - Comp Met (CMET) F/u 6 mos  Depression with anxiety - stable off meds.  - f/u 6 mos  Radicular pain of lower extremity stable. Refills provided.  - meloxicam (MOBIC) 7.5 MG tablet; Take 1 tablet (7.5 mg total) by mouth daily.  Dispense: 90 tablet; Refill: 3  Tachycardia/elevated hemoglobin/elevated liver enzymes: Heart rate decreased to 98 during visit. -Encouraged her to monitor.  Her normal heart rate is usually in the 70s/80s.  Encouraged her to hydrate. -We will repeat CBC and CMP to check her elevated hemoglobin and liver enzyme.  Again hydrate.  Atrophic vaginitis/postmenopausal/surgical: -Discussion today surrounding estrogen use.  Had prior been prescribed by her gynecological specialist after she had a complete hysterectomy secondary to endometriosis and ovarian rupture.  The specialist has since told her to follow-up with her primary care team.  Her mother has since been diagnosed with breast cancer of unknown type. - discussed risk versus benefits with her today of estrogen supplementation. -Encouraged her to speak with her mother and find out what type of breast cancer she was diagnosed with and then consider if she would like to have genetic counseling for herself. -For now she would like to continue the estrogen patches while she weans her options.  She understands the importance of self breast exams and yearly mammograms.  She is due for her mammogram this month and will schedule. -Briefly  discussed attempting to discontinue  the estrogen and treating any potential menopausal symptoms with other medication formulation such as SSRI, topical creams etc.  She will consider her options and get back to [place]. -Refills provided on estradiol 0.025 mg - 24-hour patch.  She currently uses 2 patches per week.  Follow-up 6 months.  Note is dictated utilizing voice recognition software. Although note has been proof read prior to signing, occasional typographical errors still can be missed. If any questions arise, please do not hesitate to call for verification.  Electronically signed by: Howard Pouch, DO [place]

## 2018-12-27 ENCOUNTER — Ambulatory Visit (INDEPENDENT_AMBULATORY_CARE_PROVIDER_SITE_OTHER): Payer: 59 | Admitting: Family Medicine

## 2018-12-27 ENCOUNTER — Other Ambulatory Visit (HOSPITAL_BASED_OUTPATIENT_CLINIC_OR_DEPARTMENT_OTHER): Payer: Self-pay | Admitting: Family Medicine

## 2018-12-27 DIAGNOSIS — I10 Essential (primary) hypertension: Secondary | ICD-10-CM

## 2018-12-27 DIAGNOSIS — Z1231 Encounter for screening mammogram for malignant neoplasm of breast: Secondary | ICD-10-CM

## 2018-12-27 DIAGNOSIS — R7989 Other specified abnormal findings of blood chemistry: Secondary | ICD-10-CM

## 2018-12-27 DIAGNOSIS — R945 Abnormal results of liver function studies: Secondary | ICD-10-CM | POA: Diagnosis not present

## 2018-12-28 ENCOUNTER — Other Ambulatory Visit: Payer: Self-pay

## 2018-12-28 ENCOUNTER — Ambulatory Visit (HOSPITAL_BASED_OUTPATIENT_CLINIC_OR_DEPARTMENT_OTHER)
Admission: RE | Admit: 2018-12-28 | Discharge: 2018-12-28 | Disposition: A | Discharge status: Home / Self Care | Payer: 59 | Source: Ambulatory Visit | Attending: Family Medicine | Admitting: Family Medicine

## 2018-12-28 DIAGNOSIS — Z1231 Encounter for screening mammogram for malignant neoplasm of breast: Secondary | ICD-10-CM | POA: Diagnosis present

## 2018-12-28 LAB — CBC WITH DIFFERENTIAL/PLATELET
Basophils Absolute: 0.1 10*3/uL (ref 0.0–0.1)
Basophils Relative: 0.9 % (ref 0.0–3.0)
Eosinophils Absolute: 0.2 10*3/uL (ref 0.0–0.7)
Eosinophils Relative: 2.3 % (ref 0.0–5.0)
HCT: 43.5 % (ref 36.0–46.0)
Hemoglobin: 14.5 g/dL (ref 12.0–15.0)
Lymphocytes Relative: 24.3 % (ref 12.0–46.0)
Lymphs Abs: 2.3 10*3/uL (ref 0.7–4.0)
MCHC: 33.5 g/dL (ref 30.0–36.0)
MCV: 89.6 fl (ref 78.0–100.0)
Monocytes Absolute: 0.5 10*3/uL (ref 0.1–1.0)
Monocytes Relative: 5.6 % (ref 3.0–12.0)
Neutro Abs: 6.4 10*3/uL (ref 1.4–7.7)
Neutrophils Relative %: 66.9 % (ref 43.0–77.0)
Platelets: 306 10*3/uL (ref 150.0–400.0)
RBC: 4.85 Mil/uL (ref 3.87–5.11)
RDW: 13.2 % (ref 11.5–15.5)
WBC: 9.5 10*3/uL (ref 4.0–10.5)

## 2018-12-28 LAB — COMPREHENSIVE METABOLIC PANEL
ALT: 36 U/L — ABNORMAL HIGH (ref 0–35)
AST: 22 U/L (ref 0–37)
Albumin: 4.5 g/dL (ref 3.5–5.2)
Alkaline Phosphatase: 72 U/L (ref 39–117)
BUN: 11 mg/dL (ref 6–23)
CO2: 29 mEq/L (ref 19–32)
Calcium: 9.5 mg/dL (ref 8.4–10.5)
Chloride: 102 mEq/L (ref 96–112)
Creatinine, Ser: 0.75 mg/dL (ref 0.40–1.20)
GFR: 82.02 mL/min (ref >60.00)
Glucose, Bld: 123 mg/dL — ABNORMAL HIGH (ref 70–99)
Potassium: 4.3 mEq/L (ref 3.5–5.1)
Sodium: 141 mEq/L (ref 135–145)
Total Bilirubin: 0.4 mg/dL (ref 0.2–1.2)
Total Protein: 6.9 g/dL (ref 6.0–8.3)

## 2018-12-29 ENCOUNTER — Encounter: Payer: Self-pay | Admitting: Family Medicine

## 2018-12-29 DIAGNOSIS — R928 Other abnormal and inconclusive findings on diagnostic imaging of breast: Secondary | ICD-10-CM | POA: Insufficient documentation

## 2018-12-29 HISTORY — DX: Other abnormal and inconclusive findings on diagnostic imaging of breast: R92.8

## 2019-01-01 ENCOUNTER — Other Ambulatory Visit: Payer: Self-pay | Admitting: Family Medicine

## 2019-01-01 DIAGNOSIS — R921 Mammographic calcification found on diagnostic imaging of breast: Secondary | ICD-10-CM

## 2019-01-03 ENCOUNTER — Other Ambulatory Visit: Payer: Self-pay

## 2019-01-03 ENCOUNTER — Other Ambulatory Visit: Payer: Self-pay | Admitting: Family Medicine

## 2019-01-03 ENCOUNTER — Ambulatory Visit
Admission: RE | Admit: 2019-01-03 | Discharge: 2019-01-03 | Disposition: A | Discharge status: Home / Self Care | Payer: 59 | Source: Ambulatory Visit | Attending: Family Medicine | Admitting: Family Medicine

## 2019-01-03 DIAGNOSIS — R921 Mammographic calcification found on diagnostic imaging of breast: Secondary | ICD-10-CM

## 2019-01-05 ENCOUNTER — Other Ambulatory Visit: Payer: Self-pay | Admitting: Family Medicine

## 2019-01-05 ENCOUNTER — Other Ambulatory Visit: Payer: Self-pay

## 2019-01-05 ENCOUNTER — Encounter: Payer: Self-pay | Admitting: Family Medicine

## 2019-01-05 ENCOUNTER — Ambulatory Visit
Admission: RE | Admit: 2019-01-05 | Discharge: 2019-01-05 | Disposition: A | Discharge status: Home / Self Care | Payer: 59 | Source: Ambulatory Visit | Attending: Family Medicine | Admitting: Family Medicine

## 2019-01-05 DIAGNOSIS — R921 Mammographic calcification found on diagnostic imaging of breast: Secondary | ICD-10-CM

## 2019-01-05 HISTORY — PX: OTHER SURGICAL HISTORY: SHX169

## 2019-02-27 ENCOUNTER — Other Ambulatory Visit: Payer: Self-pay

## 2019-02-27 ENCOUNTER — Ambulatory Visit (INDEPENDENT_AMBULATORY_CARE_PROVIDER_SITE_OTHER): Payer: 59

## 2019-02-27 DIAGNOSIS — Z23 Encounter for immunization: Secondary | ICD-10-CM

## 2019-05-13 ENCOUNTER — Other Ambulatory Visit: Payer: Self-pay | Admitting: Family Medicine

## 2019-06-25 ENCOUNTER — Other Ambulatory Visit: Payer: Self-pay | Admitting: Family Medicine

## 2019-06-25 DIAGNOSIS — I10 Essential (primary) hypertension: Secondary | ICD-10-CM

## 2019-06-26 ENCOUNTER — Other Ambulatory Visit: Payer: Self-pay

## 2019-06-26 ENCOUNTER — Encounter: Payer: Self-pay | Admitting: Family Medicine

## 2019-06-26 ENCOUNTER — Ambulatory Visit (INDEPENDENT_AMBULATORY_CARE_PROVIDER_SITE_OTHER): Payer: No Typology Code available for payment source | Admitting: Family Medicine

## 2019-06-26 VITALS — BP 136/88 | HR 83 | Temp 97.5°F | Resp 17 | Ht 61.0 in | Wt 218.2 lb

## 2019-06-26 DIAGNOSIS — N952 Postmenopausal atrophic vaginitis: Secondary | ICD-10-CM

## 2019-06-26 DIAGNOSIS — M546 Pain in thoracic spine: Secondary | ICD-10-CM | POA: Diagnosis not present

## 2019-06-26 DIAGNOSIS — I10 Essential (primary) hypertension: Secondary | ICD-10-CM

## 2019-06-26 DIAGNOSIS — M5410 Radiculopathy, site unspecified: Secondary | ICD-10-CM

## 2019-06-26 DIAGNOSIS — G8929 Other chronic pain: Secondary | ICD-10-CM

## 2019-06-26 DIAGNOSIS — N951 Menopausal and female climacteric states: Secondary | ICD-10-CM

## 2019-06-26 DIAGNOSIS — R0683 Snoring: Secondary | ICD-10-CM

## 2019-06-26 DIAGNOSIS — E782 Mixed hyperlipidemia: Secondary | ICD-10-CM

## 2019-06-26 MED ORDER — AMLODIPINE BESYLATE 2.5 MG PO TABS: 5.0000 mg | ORAL_TABLET | Freq: Every day | ORAL | 1 refills | Status: DC

## 2019-06-26 MED ORDER — BACLOFEN 10 MG PO TABS: 10.0000 mg | ORAL_TABLET | Freq: Two times a day (BID) | ORAL | 5 refills | Status: DC

## 2019-06-26 MED ORDER — MELOXICAM 7.5 MG PO TABS: 7.5000 mg | ORAL_TABLET | Freq: Every day | ORAL | 3 refills | Status: DC

## 2019-06-26 NOTE — Patient Instructions (Addendum)
COVID-19 Vaccine Information can be found at: [email or web address] For questions related to vaccine distribution or appointments, please email [email or web address] or call [phone].     Please help [place] help you:  We are honored you have chosen [facility] for your Primary Care home. Below you will find basic instructions that you may need to access in the future. Please help [place] help you by reading the instructions, which cover many of the frequent questions we experience.   Prescription refills and request:  -In order to allow more efficient response time, please call your pharmacy for all refills. They will forward the request electronically to [place]. This allows for the quickest possible response. Request left on a nurse line can take longer to refill, since these are checked as time allows between office patients and other phone calls.  - refill request can take up to 3-5 working days to complete.  - If request is sent electronically and request is appropiate, it is usually completed in 1-2 business days.  - all patients will need to be seen routinely for all chronic medical conditions requiring prescription medications (see follow-up below). If you are overdue for follow up on your condition, you will be asked to make an appointment and we will call in enough medication to cover you until your appointment (up to 30 days).  - all controlled substances will require a face to face visit to request/refill.  - if you desire your prescriptions to go through a new pharmacy, and have an active script at original pharmacy, you will need to call your pharmacy and have scripts transferred to new pharmacy. This is completed between the pharmacy locations and not by your provider.    Results: If any images or labs were ordered, it can take up to 1 week to get results depending on the test ordered and the lab/facility running and resulting the  test. - Normal or stable results, which do not need further discussion, may be released to your mychart immediately with attached note to you. A call may not be generated for normal results. Please make certain to sign up for mychart. If you have questions on how to activate your mychart you can call the front office.  - If your results need further discussion, our office will attempt to contact you via phone, and if unable to reach you after 2 attempts, we will release your abnormal result to your mychart with instructions.  - All results will be automatically released in mychart after 1 week.  - Your provider will provide you with explanation and instruction on all relevant material in your results. Please keep in mind, results and labs may appear confusing or abnormal to the untrained eye, but it does not mean they are actually abnormal for you personally. If you have any questions about your results that are not covered, or you desire more detailed explanation than what was provided, you should make an appointment with your provider to do so.   Our office handles many outgoing and incoming calls daily. If we have not contacted you within 1 week about your results, please check your mychart to see if there is a message first and if not, then contact our office.  In helping with this matter, you help decrease call volume, and therefore allow [place] to be able to respond to patients needs more efficiently.   Acute office visits (sick visit):  An acute visit is intended for a new problem and are scheduled in shorter time  slots to allow schedule openings for patients with new problems. This is the appropriate visit to discuss a new problem. Problems will not be addressed by phone call or Echart message. Appointment is needed if requesting treatment. In order to provide you with excellent quality medical care with proper time for you to explain your problem, have an exam and receive treatment with instructions,  these appointments should be limited to one new problem per visit. If you experience a new problem, in which you desire to be addressed, please make an acute office visit, we save openings on the schedule to accommodate you. Please do not save your new problem for any other type of visit, let us take care of it properly and quickly for you.   Follow up visits:  Depending on your condition(s) your provider will need to see you routinely in order to provide you with quality care and prescribe medication(s). Most chronic conditions (Example: hypertension, Diabetes, depression/anxiety... etc), require visits a couple times a year. Your provider will instruct you on proper follow up for your personal medical conditions and history. Please make certain to make follow up appointments for your condition as instructed. Failing to do so could result in lapse in your medication treatment/refills. If you request a refill, and are overdue to be seen on a condition, we will always provide you with a 30 day script (once) to allow you time to schedule.    Medicare wellness (well visit): - we have a wonderful Nurse Selena Batten), that will meet with you and provide you will yearly medicare wellness visits. These visits should occur yearly (can not be scheduled less than 1 calendar year apart) and cover preventive health, immunizations, advance directives and screenings you are entitled to yearly through your medicare benefits. Do not miss out on your entitled benefits, this is when medicare will pay for these benefits to be ordered for you.  These are strongly encouraged by your provider and is the appropriate type of visit to make certain you are up to date with all preventive health benefits. If you have not had your medicare wellness exam in the last 12 months, please make certain to schedule one by calling the office and schedule your medicare wellness with Selena Batten as soon as possible.   Yearly physical (well visit):  - Adults are  recommended to be seen yearly for physicals. Check with your insurance and date of your last physical, most insurances require one calendar year between physicals. Physicals include all preventive health topics, screenings, medical exam and labs that are appropriate for gender/age and history. You may have fasting labs needed at this visit. This is a well visit (not a sick visit), new problems should not be covered during this visit (see acute visit).  - Pediatric patients are seen more frequently when they are younger. Your provider will advise you on well child visit timing that is appropriate for your their age. - This is not a medicare wellness visit. Medicare wellness exams do not have an exam portion to the visit. Some medicare companies allow for a physical, some do not allow a yearly physical. If your medicare allows a yearly physical you can schedule the medicare wellness with our nurse Selena Batten and have your physical with your provider after, on the same day. Please check with insurance for your full benefits.

## 2019-06-26 NOTE — Progress Notes (Signed)
VIRTUAL VISIT VIA VIDEO  I connected with Maria [name] on 07/02/19 at  8:30 AM EST by a video enabled telemedicine application and verified that I am speaking with the correct person using two identifiers. Location patient: Home Location provider: [facility], Office Persons participating in the virtual visit: Patient, Dr. Claiborne Billings and R.Baker, LPN  I discussed the limitations of evaluation and management by telemedicine and the availability of in person appointments. The patient expressed understanding and agreed to proceed.   Patient ID: Maria [name], female  DOB: 1971-02-12, 49 y.o.   MRN: [id number] Patient Care Team    Relationship Specialty Notifications Start End  Natalia Leatherwood, DO PCP - General Family Medicine  03/31/17   Erle Crocker, MD Consulting Physician Glaucoma Ophthalmology  03/31/17   Jerrell Mylar, MD Consulting Physician Gynecology  03/31/17     Chief Complaint  Patient presents with  . Hypertension    Pt is doing well but does complain of some high BP readings at home with tachycardia. Pt has not taken BP medication today.   . Hyperlipidemia  . Depression  . Anxiety    Subjective:  Maria [name] is a 49 y.o.  female present for follow up Atrophic vaginitis/postmeopausal: Patient reports she has been on estrogen patches twice weekly since 2017 when she had her total hysterectomy/BSO for endometriosis and ovarian cyst rupture.  She states she never had diagnosis of cancer.  She is up-to-date with her mammograms.  She has a family history of breast cancer in her mother (age 27).  She performs self breast exams.  She endorses mild vaginal dryness.  She denies any history of hot flashes or other menopausal symptoms.  Hypertension/hyperlipidemia: Pt reports compliance with amlodipine 2.5 mg daily. Blood pressures ranges at home are borderline to elevated as well. Patient denies chest pain, shortness of breath, dizziness or lower extremity edema.    Exercise: Routine exercise RF: Hypertension, hyperlipidemia, obesity, Fhx heart disease  Low back pain- radiculopathy: Doing well  on Mobic 7.5 mg Qd.  She is endorsing having more muscle spasms and tightening feeling of her lower back when bending.  She denies any bladder or bowel problem.  Snoring: Endorses increased snoring now she states it is much worse.  Patient is obese, hypertension and snoring.  She denies daytime somnolence or headaches.  She does endorse allergies and nasal congestion.  She would like a referral for a sleep apnea eval today.  She complains today of numbness and tingling in her right medial fingers.  She has questions about gastric bypass today.  Depression screen [facility] 2/9 06/26/2019 12/26/2018 12/26/2018 05/15/2018 05/02/2017  Decreased Interest 0 0 0 1 0  Down, Depressed, Hopeless 0 0 0 1 0  PHQ - 2 Score 0 0 0 2 0  Altered sleeping 0 0 - 1 0  Tired, decreased energy 0 0 - 1 0  Change in appetite 0 0 - 1 0  Feeling bad or failure about yourself  0 0 - 1 0  Trouble concentrating 0 1 - 1 0  Moving slowly or fidgety/restless 0 0 - 2 0  Suicidal thoughts 0 0 - 0 0  PHQ-9 Score 0 1 - 9 0  Difficult doing work/chores Not difficult at all Not difficult at all - Not difficult at all -   GAD 7 : Generalized Anxiety Score 06/26/2019 12/26/2018 05/15/2018 05/02/2017  Nervous, Anxious, on Edge 0 0 1 0  Control/stop worrying 0 0 1  0  Worry too much - different things 0 0 1 0  Trouble relaxing 0 1 1 0  Restless 0 0 1 0  Easily annoyed or irritable 1 0 1 0  Afraid - awful might happen 0 0 3 2  Total GAD 7 Score 1 1 9 2   Anxiety Difficulty Not difficult at all Not difficult at all Not difficult at all Somewhat difficult       Fall Risk  03/31/2017  Falls in the past year? No     Immunization History  Administered Date(s) Administered  . Influenza Split 04/17/2012  . Influenza,inj,Quad PF,6+ Mos 03/28/2014, 01/26/2016, 03/29/2018, 02/27/2019  .  Influenza-Unspecified 03/16/2017, 03/29/2018, 02/27/2019  . Tdap 04/30/2014   No exam data present  Past Medical History:  Diagnosis Date  . Abnormal mammogram of left breast 12/29/2018   01/10/2019: ADDENDUM: Pathology revealed FIBROCYSTIC CHANGE, INCLUDING FIBRO-ADENOMATOID CHANGE AND DYSTROPHIC CALCIFICATIONS of the LEFT breast, lower outer quadrant. This was found to be concordant by Dr. 03/12/2019.  The patient was instructed to return for annual screening mammography and informed a reminder notice would be sent regarding this appointment.  . Depression with anxiety   . 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 Endometriosis 11/10/2015  . History of thyroid nodule 2011  . Hypertension   . Vaginal prolapse 2017   Allergies  Allergen Reactions  . Oxycodone-Acetaminophen Anaphylaxis  . Asa [Aspirin] Nausea And Vomiting   Past Surgical History:  Procedure Laterality Date  . OOPHORECTOMY Right 07/23/15   Dr 07/25/15  . surgical breast clip Left 01/05/2019  . TOTAL ABDOMINAL HYSTERECTOMY  10/2015   BSO also; endometriosis   Family History  Problem Relation Age of Onset  . Hyperlipidemia Maternal Grandmother   . Hypertension Maternal Grandmother   . Diabetes Maternal Grandfather   . Psoriasis Maternal Grandfather   . Heart disease Maternal Grandfather   . Hyperlipidemia Maternal Grandfather   . Hypertension Maternal Grandfather   . Heart disease Paternal Grandfather   . Breast cancer Mother 67  . Prostate cancer Father    Social History   Socioeconomic History  . Marital status: Married    Spouse name: Not on file  . Number of children: 2  . Years of education: 59  . Highest education level: Not on file  Occupational History  . Occupation: 18  Tobacco Use  . Smoking status: Never Smoker  . Smokeless tobacco: Never Used  Substance and Sexual Activity  . Alcohol use: No  . Drug use: No  . Sexual activity: Yes    Partners: Male  Other Topics  Concern  . Not on file  Social History Narrative   Married with 2 children.   Smoke alarm in the home, wears her seatbelt.   Regular exercise:  Walks once a week   Caffeine use:  1-2 cups coffee daily   She works as a Engineer, building services.    She has an associates degree.   Enjoys crafting/crocheting/[place]   Feels safe in her relationships.   Social Determinants of Health   Financial Resource Strain:   . Difficulty of Paying Living Expenses: Not on file  Food Insecurity:   . Worried About Agricultural engineer in the Last Year: Not on file  . Ran Out of Food in the Last Year: Not on file  Transportation Needs:   . Lack of Transportation (Medical): Not on file  . Lack of Transportation (Non-Medical): Not on file  Physical  Activity:   . Days of Exercise per Week: Not on file  . Minutes of Exercise per Session: Not on file  Stress:   . Feeling of Stress : Not on file  Social Connections:   . Frequency of Communication with Friends and Family: Not on file  . Frequency of Social Gatherings with Friends and Family: Not on file  . Attends Religious Services: Not on file  . Active Member of Clubs or Organizations: Not on file  . Attends Banker Meetings: Not on file  . Marital Status: Not on file  Intimate Partner Violence:   . Fear of Current or Ex-Partner: Not on file  . Emotionally Abused: Not on file  . Physically Abused: Not on file  . Sexually Abused: Not on file   Allergies as of 06/26/2019      Reactions   Oxycodone-acetaminophen Anaphylaxis   Asa [aspirin] Nausea And Vomiting      Medication List       Accurate as of June 26, 2019 11:59 PM. If you have any questions, ask your nurse or doctor.        amLODipine 2.5 MG tablet Commonly known as: NORVASC Take 2 tablets (5 mg total) by mouth daily. What changed: how much to take Changed by: Felix Pacini, DO   B-12 1000 MCG Tabs Take by mouth.   baclofen 10 MG tablet Commonly known as:  LIORESAL Take 1 tablet (10 mg total) by mouth 2 (two) times daily. Started by: Felix Pacini, DO   estradiol 0.025 MG/24HR Commonly known as: VIVELLE-DOT APPLY 1 PATCH TWICE A WEEK   meloxicam 7.5 MG tablet Commonly known as: MOBIC Take 1 tablet (7.5 mg total) by mouth daily.   RED YEAST RICE PO Take by mouth 2 (two) times a day.   TURMERIC PO Take by mouth.       All past medical history, surgical history, allergies, family history, immunizations andmedications were updated in the EMR today and reviewed under the history and medication portions of their EMR.    No results found for this or any previous visit (from the past 2160 hour(s)).   ROS: 14 pt review of systems performed and negative (unless mentioned in an HPI)  Objective: BP 136/88 (BP Location: Left Arm, Patient Position: Sitting, Cuff Size: Large)   Pulse 83   Temp (!) 97.5 F (36.4 C) (Temporal)   Resp 17   Ht 5\' 1"  (1.549 m)   Wt 218 lb 4 oz (99 kg)   LMP 05/28/2015 (Exact Date)   SpO2 97%   BMI 41.24 kg/m  Gen: Afebrile. No acute distress.  Pleasant, obese female. HENT: AT. [place]. Bilateral TM visualized and normal in appearance. MMM. Bilateral nares without erythema swelling or drainage. Throat without erythema or exudates.  No cough or shortness of breath Eyes:Pupils Equal Round Reactive to light, Extraocular movements intact,  Conjunctiva without redness, discharge or icterus. Neck/lymp/endocrine: Supple, no lymphadenopathy, no thyromegaly CV: RRR no murmur, no edema Chest: CTAB, no wheeze or crackles Abd: Soft.  Obese. NTND. BS present.  No masses palpated. MSK: Negative Tinel's at wrist.      -Thoracic/lumbar: No erythema, no soft tissue swelling.  No bony tenderness.  Mild thoracic paraspinal fullness/ropiness.  Discomfort with rotation, flexion and sidebending.  Neurovascularly intact distally. Skin: No rashes, purpura or petechiae.  Neuro:  Normal gait. PERLA. EOMi. Alert. Oriented x3  Psych:  Normal affect, dress and demeanor. Normal speech. Normal thought content and judgment.  Assessment/plan: Maria [name] is a 49 y.o. female present for establishing care with a new physician within the same group. Essential hypertension/Morbid obesity/hyperlipidemia - borderline today (no meds) and home pressures have been about the same to higher.  - Increase amlodipine 5 mg (2.5 mg QD) - monitor BP 2 hours after med.  F/u 6 mos  Radicular pain of lower extremity/chronic thoracic back pain - worsening Stable radicuopathy but wrosening spasms'. Continue Mobic 7.5 mg daily. Start baclofen twice daily. -Referral to physical therapy placed.  Atrophic vaginitis/postmenopausal/surgical: -Discussion today surrounding estrogen use.  Had prior been prescribed by her gynecological specialist after she had a complete hysterectomy secondary to endometriosis and ovarian rupture.  The specialist has since told her to follow-up with her primary care team.  Her mother has since been diagnosed with breast cancer of unknown type. - discussed risk versus benefits with her today of estrogen supplementation. -She had elected to continue the estrogen patches.  She will perform self breast exams.  She understands the importance of yearly mammograms. -She did not ask for refills today.  She had an abnormal mammogram, with additional imaging that proved benign last year. -Did not refill the patches today, however if she calls for refills will refill without additional appointment.  Snoring - new - Patient was encouraged to start daily allergy medicine before bed.  We will referral to pulmonology at her request -Weight loss encouraged. - Ambulatory referral to Pulmonology  Hand discomfort: -New - Patient with possibly early signs of carpal tunnel.  Encourage carpal tunnel stretches and carpal tunnel night splint to start.  Obesity:  - new   Discussion surrounding gastric bypass surgery.  -Patient was  briefly counseled on prerequisites of attempts at weight loss program and exercise. -Typically referral surrounds a multidisciplinary work-up. -If desiring further information referral will place for her.  Orders Placed This Encounter  Procedures  . Ambulatory referral to Physical Therapy  . Ambulatory referral to Pulmonology   Meds ordered this encounter  Medications  . amLODipine (NORVASC) 2.5 MG tablet    Sig: Take 2 tablets (5 mg total) by mouth daily.    Dispense:  180 tablet    Refill:  1  . baclofen (LIORESAL) 10 MG tablet    Sig: Take 1 tablet (10 mg total) by mouth 2 (two) times daily.    Dispense:  60 each    Refill:  5  . meloxicam (MOBIC) 7.5 MG tablet    Sig: Take 1 tablet (7.5 mg total) by mouth daily.    Dispense:  90 tablet    Refill:  3    Referral Orders     Ambulatory referral to Physical Therapy     Ambulatory referral to Pulmonology  Note is dictated utilizing voice recognition software. Although note has been proof read prior to signing, occasional typographical errors still can be missed. If any questions arise, please do not hesitate to call for verification.  Electronically signed by: Howard Pouch, DO [place]

## 2019-07-02 ENCOUNTER — Encounter: Payer: Self-pay | Admitting: Family Medicine

## 2019-07-02 DIAGNOSIS — N951 Menopausal and female climacteric states: Secondary | ICD-10-CM | POA: Insufficient documentation

## 2019-07-02 DIAGNOSIS — N952 Postmenopausal atrophic vaginitis: Secondary | ICD-10-CM | POA: Insufficient documentation

## 2019-08-02 ENCOUNTER — Telehealth: Payer: Self-pay

## 2019-08-02 MED ORDER — ESTRADIOL 0.025 MG/24HR TD PTTW: MEDICATED_PATCH | TRANSDERMAL | 3 refills | Status: DC

## 2019-08-02 NOTE — Telephone Encounter (Signed)
Refill request for patients Estrogen patches. Per last OV pt could have refills without OV as she was seen in 06/2019. Sent to pharmacy

## 2019-08-24 ENCOUNTER — Ambulatory Visit: Payer: 59 | Attending: Internal Medicine

## 2019-08-24 DIAGNOSIS — Z23 Encounter for immunization: Secondary | ICD-10-CM

## 2019-08-24 NOTE — Progress Notes (Signed)
   Covid-19 Vaccination Clinic  Name:  Maria [name]    MRN: [id number] DOB: 1970-09-15  08/24/2019  [name] was observed post Covid-19 immunization for 15 minutes without incident. She was provided with Vaccine Information Sheet and instruction to access the V-Safe system.   [name] was instructed to call 911 with any severe reactions post vaccine: Marland Kitchen Difficulty breathing  . Swelling of face and throat  . A fast heartbeat  . A bad rash all over body  . Dizziness and weakness   Immunizations Administered    Name Date Dose VIS Date Route   Pfizer COVID-19 Vaccine 08/24/2019  9:14 AM 0.3 mL 05/11/2019 Intramuscular   Manufacturer: [employer], [employer]   Lot: [id number]   NDC: [address]-6

## 2019-09-18 ENCOUNTER — Ambulatory Visit: Payer: 59 | Attending: Internal Medicine

## 2019-09-18 DIAGNOSIS — Z23 Encounter for immunization: Secondary | ICD-10-CM

## 2020-01-10 ENCOUNTER — Other Ambulatory Visit: Payer: Self-pay

## 2020-01-10 DIAGNOSIS — G8929 Other chronic pain: Secondary | ICD-10-CM

## 2020-01-10 DIAGNOSIS — M5410 Radiculopathy, site unspecified: Secondary | ICD-10-CM

## 2020-01-10 MED ORDER — BACLOFEN 10 MG PO TABS: 10.0000 mg | ORAL_TABLET | Freq: Two times a day (BID) | ORAL | 0 refills | Status: DC

## 2020-01-10 NOTE — Telephone Encounter (Signed)
RF request for baclofen 10 mg tab LOV:06/26/19 Next ov: n/a Last written:06/26/2019 (60,5)  Please advise if medication refill is appropriate.

## 2020-01-10 NOTE — Telephone Encounter (Signed)
Received refill request for baclofen.  Patient was due for her chronic medical condition 93-month follow-up mid July. I did refill 30 days worth of baclofen for her.  Please schedule her for her chronic medical condition follow-up ASAP so that we can get the rest of her medications also filled for her.

## 2020-01-10 NOTE — Telephone Encounter (Signed)
Spoke with pt to let her know that her medication has been sent to the pharmacy. She will call to make an appointment or make it through MyChart.

## 2020-07-02 ENCOUNTER — Other Ambulatory Visit: Payer: Self-pay

## 2020-07-02 DIAGNOSIS — I10 Essential (primary) hypertension: Secondary | ICD-10-CM

## 2020-07-02 MED ORDER — AMLODIPINE BESYLATE 2.5 MG PO TABS: 5.0000 mg | ORAL_TABLET | Freq: Every day | ORAL | 0 refills | Status: DC

## 2020-09-11 ENCOUNTER — Other Ambulatory Visit: Payer: Self-pay | Admitting: Family Medicine

## 2020-09-11 DIAGNOSIS — Z1231 Encounter for screening mammogram for malignant neoplasm of breast: Secondary | ICD-10-CM

## 2020-10-09 ENCOUNTER — Ambulatory Visit
Admission: RE | Admit: 2020-10-09 | Discharge: 2020-10-09 | Disposition: A | Discharge status: Home / Self Care | Payer: No Typology Code available for payment source | Source: Ambulatory Visit | Attending: Family Medicine | Admitting: Family Medicine

## 2020-10-09 ENCOUNTER — Ambulatory Visit: Payer: No Typology Code available for payment source

## 2020-10-09 ENCOUNTER — Ambulatory Visit: Payer: 59

## 2020-10-09 ENCOUNTER — Other Ambulatory Visit: Payer: Self-pay

## 2020-10-09 DIAGNOSIS — Z1231 Encounter for screening mammogram for malignant neoplasm of breast: Secondary | ICD-10-CM

## 2021-09-11 ENCOUNTER — Ambulatory Visit (INDEPENDENT_AMBULATORY_CARE_PROVIDER_SITE_OTHER): Payer: No Typology Code available for payment source | Admitting: Family Medicine

## 2021-09-11 ENCOUNTER — Encounter: Payer: Self-pay | Admitting: Family Medicine

## 2021-09-11 VITALS — BP 130/83 | HR 89 | Temp 98.7°F | Ht 62.0 in | Wt 208.0 lb

## 2021-09-11 DIAGNOSIS — Z79890 Hormone replacement therapy (postmenopausal): Secondary | ICD-10-CM | POA: Insufficient documentation

## 2021-09-11 DIAGNOSIS — E782 Mixed hyperlipidemia: Secondary | ICD-10-CM

## 2021-09-11 DIAGNOSIS — Z23 Encounter for immunization: Secondary | ICD-10-CM | POA: Diagnosis not present

## 2021-09-11 DIAGNOSIS — Z1159 Encounter for screening for other viral diseases: Secondary | ICD-10-CM

## 2021-09-11 DIAGNOSIS — Z0000 Encounter for general adult medical examination without abnormal findings: Secondary | ICD-10-CM | POA: Diagnosis not present

## 2021-09-11 DIAGNOSIS — I10 Essential (primary) hypertension: Secondary | ICD-10-CM

## 2021-09-11 DIAGNOSIS — D582 Other hemoglobinopathies: Secondary | ICD-10-CM

## 2021-09-11 DIAGNOSIS — Z1211 Encounter for screening for malignant neoplasm of colon: Secondary | ICD-10-CM

## 2021-09-11 DIAGNOSIS — Z1231 Encounter for screening mammogram for malignant neoplasm of breast: Secondary | ICD-10-CM

## 2021-09-11 DIAGNOSIS — N951 Menopausal and female climacteric states: Secondary | ICD-10-CM

## 2021-09-11 DIAGNOSIS — N952 Postmenopausal atrophic vaginitis: Secondary | ICD-10-CM

## 2021-09-11 LAB — LIPID PANEL
Cholesterol: 213 mg/dL — ABNORMAL HIGH (ref 0–200)
HDL: 40.7 mg/dL (ref >39.00)
NonHDL: 172.26
Total CHOL/HDL Ratio: 5
Triglycerides: 282 mg/dL — ABNORMAL HIGH (ref 0.0–149.0)
VLDL: 56.4 mg/dL — ABNORMAL HIGH (ref 0.0–40.0)

## 2021-09-11 LAB — LDL CHOLESTEROL, DIRECT: Direct LDL: 164 mg/dL

## 2021-09-11 NOTE — Patient Instructions (Addendum)
?Great to see you today.  ?I have refilled the medication(s) we provide.  ? ?If labs were collected, we will inform you of lab results once received either by echart message or telephone call.  ? - echart message- for normal results that have been seen by the patient already.  ? - telephone call: abnormal results or if patient has not viewed results in their echart. ? ?Return in about 1 year (around 09/13/2022) for cpe (20 min). ? ? ?Health Maintenance, Female ?Adopting a healthy lifestyle and getting preventive care are important in promoting health and wellness. Ask your health care provider about: ?The right schedule for you to have regular tests and exams. ?Things you can do on your own to prevent diseases and keep yourself healthy. ?What should I know about diet, weight, and exercise? ?Eat a healthy diet ? ?Eat a diet that includes plenty of vegetables, fruits, low-fat dairy products, and lean protein. ?Do not eat a lot of foods that are high in solid fats, added sugars, or sodium. ?Maintain a healthy weight ?Body mass index (BMI) is used to identify weight problems. It estimates body fat based on height and weight. Your health care provider can help determine your BMI and help you achieve or maintain a healthy weight. ?Get regular exercise ?Get regular exercise. This is one of the most important things you can do for your health. Most adults should: ?Exercise for at least 150 minutes each week. The exercise should increase your heart rate and make you sweat (moderate-intensity exercise). ?Do strengthening exercises at least twice a week. This is in addition to the moderate-intensity exercise. ?Spend less time sitting. Even light physical activity can be beneficial. ?Watch cholesterol and blood lipids ?Have your blood tested for lipids and cholesterol at 52 years of age, then have this test every 5 years. ?Have your cholesterol levels checked more often if: ?Your lipid or cholesterol levels are high. ?You are  older than 52 years of age. ?You are at high risk for heart disease. ?What should I know about cancer screening? ?Depending on your health history and family history, you may need to have cancer screening at various ages. This may include screening for: ?Breast cancer. ?Cervical cancer. ?Colorectal cancer. ?Skin cancer. ?Lung cancer. ?What should I know about heart disease, diabetes, and high blood pressure? ?Blood pressure and heart disease ?High blood pressure causes heart disease and increases the risk of stroke. This is more likely to develop in people who have high blood pressure readings or are overweight. ?Have your blood pressure checked: ?Every 3-5 years if you are 52-52 years of age. ?Every year if you are 50 years old or older. ?Diabetes ?Have regular diabetes screenings. This checks your fasting blood sugar level. Have the screening done: ?Once every three years after age 52 if you are at a normal weight and have a low risk for diabetes. ?More often and at a younger age if you are overweight or have a high risk for diabetes. ?What should I know about preventing infection? ?Hepatitis B ?If you have a higher risk for hepatitis B, you should be screened for this virus. Talk with your health care provider to find out if you are at risk for hepatitis B infection. ?Hepatitis C ?Testing is recommended for: ?Everyone born from [birth year] through 1966. ?Anyone with known risk factors for hepatitis C. ?Sexually transmitted infections (STIs) ?Get screened for STIs, including gonorrhea and chlamydia, if: ?You are sexually active and are younger than 52 years of age. ?  You are older than 52 years of age and your health care provider tells you that you are at risk for this type of infection. ?Your sexual activity has changed since you were last screened, and you are at increased risk for chlamydia or gonorrhea. Ask your health care provider if you are at risk. ?Ask your health care provider about whether you are at high risk  for HIV. Your health care provider may recommend a prescription medicine to help prevent HIV infection. If you choose to take medicine to prevent HIV, you should first get tested for HIV. You should then be tested every 3 months for as long as you are taking the medicine. ?Pregnancy ?If you are about to stop having your period (premenopausal) and you may become pregnant, seek counseling before you get pregnant. ?Take 400 to 800 micrograms (mcg) of folic acid every day if you become pregnant. ?Ask for birth control (contraception) if you want to prevent pregnancy. ?Osteoporosis and menopause ?Osteoporosis is a disease in which the bones lose minerals and strength with aging. This can result in bone fractures. If you are 30 years old or older, or if you are at risk for osteoporosis and fractures, ask your health care provider if you should: ?Be screened for bone loss. ?Take a calcium or vitamin D supplement to lower your risk of fractures. ?Be given hormone replacement therapy (HRT) to treat symptoms of menopause. ?Follow these instructions at home: ?Alcohol use ?Do not drink alcohol if: ?Your health care provider tells you not to drink. ?You are pregnant, may be pregnant, or are planning to become pregnant. ?If you drink alcohol: ?Limit how much you have to: ?0-1 drink a day. ?Know how much alcohol is in your drink. In the [place] one drink equals one 12 oz bottle of beer (355 mL), one 5 oz glass of wine (148 mL), or one 1? oz glass of hard liquor (44 mL). ?Lifestyle ?Do not use any products that contain nicotine or tobacco. These products include cigarettes, chewing tobacco, and vaping devices, such as e-cigarettes. If you need help quitting, ask your health care provider. ?Do not use street drugs. ?Do not share needles. ?Ask your health care provider for help if you need support or information about quitting drugs. ?General instructions ?Schedule regular health, dental, and eye exams. ?Stay current with your  vaccines. ?Tell your health care provider if: ?You often feel depressed. ?You have ever been abused or do not feel safe at home. ?Summary ?Adopting a healthy lifestyle and getting preventive care are important in promoting health and wellness. ?Follow your health care provider's instructions about healthy diet, exercising, and getting tested or screened for diseases. ?Follow your health care provider's instructions on monitoring your cholesterol and blood pressure. ?This information is not intended to replace advice given to you by your health care provider. Make sure you discuss any questions you have with your health care provider. ?Document Revised: 10/06/2020 Document Reviewed: 10/06/2020 ?Elsevier Patient Education ? [place]. ? ?

## 2021-09-11 NOTE — Progress Notes (Signed)
? ?This visit occurred during the SARS-CoV-2 public health emergency.  Safety protocols were in place, including screening questions prior to the visit, additional usage of staff PPE, and extensive cleaning of exam room while observing appropriate contact time as indicated for disinfecting solutions.  ? ? ?Patient ID: Maria [name], female  DOB: [birth year], 51 y.o.   MRN: [id number] ?Patient Care Team  ?  Relationship Specialty Notifications Start End  ?Ma Hillock, DO PCP - General Family Medicine  03/31/17   ?Eben Burow, MD Consulting Physician Glaucoma Ophthalmology  03/31/17   ?Marti Sleigh, MD Consulting Physician Gynecology  03/31/17   ? ? ?Chief Complaint  ?Patient presents with  ? Annual Exam  ?  Pt is fasting  ? ? ?Subjective: ?Maria [name] is a 51 y.o.  Female  present for CPE/CMC. ?All past medical history, surgical history, allergies, family history, immunizations, medications and social history were updated in the electronic medical record today. ?All recent labs, ED visits and hospitalizations within the last year were reviewed. ? ?Health maintenance:  ?Colonoscopy:Never. Cologuard ordered today ?Mammogram:FHX present in mother. completed 10/09/2020 - [facility] ?Cervical cancer screening: h/o hysterectomy ?Immunizations: tdap UTD 2015, covid completed, flu shot UTD 2022. Shingrix #1 today (nurse visit for #2 2-6 mos.  ?Infectious disease screening: Hep c completed today ?Assistive device: none ?Oxygen [id number] ?Patient has a Dental home. ?Hospitalizations/ED visits: reviewed ? ? ?Hypertension/hyperlipidemia/obesity: ?Pt reports noncompliance with amlodipine 2.5 mg daily. Patient denies chest pain, shortness of breath, dizziness or lower extremity edema.  ?Exercise: Routine exercise ?RF: Hypertension, hyperlipidemia, obesity, Fhx heart disease ?  ? ? ?  09/11/2021  ?  1:08 PM 06/26/2019  ?  8:35 AM 12/26/2018  ?  1:03 PM 12/26/2018  ?  1:02 PM 05/15/2018  ?  2:47 PM  ?Depression screen PHQ 2/9   ?Decreased Interest 0 0 0 0 1  ?Down, Depressed, Hopeless 0 0 0 0 1  ?PHQ - 2 Score 0 0 0 0 2  ?Altered sleeping  0 0  1  ?Tired, decreased energy  0 0  1  ?Change in appetite  0 0  1  ?Feeling bad or failure about yourself   0 0  1  ?Trouble concentrating  0 1  1  ?Moving slowly or fidgety/restless  0 0  2  ?Suicidal thoughts  0 0  0  ?PHQ-9 Score  0 1  9  ?Difficult doing work/chores  Not difficult at all Not difficult at all  Not difficult at all  ? ? ?  06/26/2019  ?  8:36 AM 12/26/2018  ?  1:02 PM 05/15/2018  ?  2:49 PM 05/02/2017  ?  8:52 AM  ?GAD 7 : Generalized Anxiety Score  ?Nervous, Anxious, on Edge 0 0 1 0  ?Control/stop worrying 0 0 1 0  ?Worry too much - different things 0 0 1 0  ?Trouble relaxing 0 1 1 0  ?Restless 0 0 1 0  ?Easily annoyed or irritable 1 0 1 0  ?Afraid - awful might happen 0 0 3 2  ?Total GAD 7 Score 1 1 9 2   ?Anxiety Difficulty Not difficult at all Not difficult at all Not difficult at all Somewhat difficult  ? ? ? ? ?Immunization History  ?Administered Date(s) Administered  ? Influenza Split 04/17/2012  ? Influenza,inj,Quad PF,6+ Mos 03/28/2014, 01/26/2016, 03/29/2018, 02/27/2019, 05/01/2021  ? Influenza-Unspecified 03/16/2017, 03/29/2018, 02/27/2019  ? PFIZER(Purple Top)SARS-COV-2 Vaccination 08/24/2019, 09/18/2019  ? Pension scheme manager  87yrs & up 05/01/2021  ? Tdap 04/30/2014  ? Zoster Recombinat (Shingrix) 09/11/2021  ? ? ?Past Medical History:  ?Diagnosis Date  ? Abnormal mammogram of left breast 12/29/2018  ? 01/10/2019: ADDENDUM: Pathology revealed FIBROCYSTIC CHANGE, INCLUDING FIBRO-ADENOMATOID CHANGE AND DYSTROPHIC CALCIFICATIONS of the LEFT breast, lower outer quadrant. This was found to be concordant by Dr. Lillia Mountain.   The patient was instructed to return for annual screening mammography and informed a reminder notice would be sent regarding this appointment.  ? Depression with anxiety   ? 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 Left eye  ? Endometriosis  11/10/2015  ? History of thyroid nodule 2011  ? Hypertension   ? Vaginal prolapse 2017  ? ?Allergies  ?Allergen Reactions  ? Oxycodone-Acetaminophen Anaphylaxis  ? Asa [Aspirin] Nausea And Vomiting  ? ?Past Surgical History:  ?Procedure Laterality Date  ? OOPHORECTOMY Right 07/23/15  ? Dr Gertie Fey  ? surgical breast clip Left 01/05/2019  ? TOTAL ABDOMINAL HYSTERECTOMY  10/2015  ? BSO also; endometriosis  ? ?Family History  ?Problem Relation Age of Onset  ? Hyperlipidemia Maternal Grandmother   ? Hypertension Maternal Grandmother   ? Diabetes Maternal Grandfather   ? Psoriasis Maternal Grandfather   ? Heart disease Maternal Grandfather   ? Hyperlipidemia Maternal Grandfather   ? Hypertension Maternal Grandfather   ? Heart disease Paternal Grandfather   ? Breast cancer Mother 48  ? Prostate cancer Father   ? ?Social History  ? ?Social History Narrative  ? Married with 2 children.  ? Smoke alarm in the home, wears her seatbelt.  ? Regular exercise:  Walks once a week  ? Caffeine use:  1-2 cups coffee daily  ? She works as a Therapist, nutritional.   ? She has an associates degree.  ? Enjoys crafting/crocheting/Nascar  ? Feels safe in her relationships.  ? ? ?Allergies as of 09/11/2021   ? ?   Reactions  ? Oxycodone-acetaminophen Anaphylaxis  ? Asa [aspirin] Nausea And Vomiting  ? ?  ? ?  ?Medication List  ?  ? ?  ? Accurate as of September 11, 2021  5:04 PM. If you have any questions, ask your nurse or doctor.  ?  ?  ? ?  ? ?STOP taking these medications   ? ?amLODipine 2.5 MG tablet ?Commonly known as: NORVASC ?Stopped by: Howard Pouch, DO ?  ?baclofen 10 MG tablet ?Commonly known as: LIORESAL ?Stopped by: Howard Pouch, DO ?  ?estradiol 0.025 MG/24HR ?Commonly known as: VIVELLE-DOT ?Stopped by: Howard Pouch, DO ?  ?meloxicam 7.5 MG tablet ?Commonly known as: MOBIC ?Stopped by: Howard Pouch, DO ?  ?RED YEAST RICE PO ?Stopped by: Howard Pouch, DO ?  ?TURMERIC PO ?Stopped by: Howard Pouch, DO ?  ? ?  ? ?TAKE these medications    ? ?B-12 1000 MCG Tabs ?Take by mouth. ?  ? ?  ? ? ?All past medical history, surgical history, allergies, family history, immunizations andmedications were updated in the EMR today and reviewed under the history and medication portions of their EMR.    ? ?Recent Results (from the past 2160 hour(s))  ?Lipid panel     Status: Abnormal  ? Collection Time: 09/11/21  1:30 PM  ?Result Value Ref Range  ? Cholesterol 213 (H) 0 - 200 mg/dL  ?  Comment: ATP III Classification       Desirable:  < 200 mg/dL  Borderline High:  200 - 239 mg/dL          High:  > = 240 mg/dL  ? Triglycerides 282.0 (H) 0.0 - 149.0 mg/dL  ?  Comment: Normal:  <150 mg/dLBorderline High:  150 - 199 mg/dL  ? HDL 40.70 >39.00 mg/dL  ? VLDL 56.4 (H) 0.0 - 40.0 mg/dL  ? Total CHOL/HDL Ratio 5   ?  Comment:                Men          Women1/2 Average Risk     3.4          3.3Average Risk          5.0          4.42X Average Risk          9.6          7.13X Average Risk          15.0          11.0                      ? NonHDL 172.26   ?  Comment: NOTE:  Non-HDL goal should be 30 mg/dL higher than patient's LDL goal (i.e. LDL goal of < 70 mg/dL, would have non-HDL goal of < 100 mg/dL)  ?LDL cholesterol, direct     Status: None  ? Collection Time: 09/11/21  1:30 PM  ?Result Value Ref Range  ? Direct LDL 164.0 mg/dL  ?  Comment: Optimal:  <100 mg/dLNear or Above Optimal:  100-129 mg/dLBorderline High:  130-159 mg/dLHigh:  160-189 mg/dLVery High:  >190 mg/dL  ? ? ?MM 3D SCREEN BREAST BILATERAL ? ?Result Date: 10/09/2020 ?CLINICAL DATA:  Screening. EXAM: DIGITAL SCREENING BILATERAL MAMMOGRAM WITH TOMOSYNTHESIS AND CAD TECHNIQUE: Bilateral screening digital craniocaudal and mediolateral oblique mammograms were obtained. Bilateral screening digital breast tomosynthesis was performed. The images were evaluated with computer-aided detection. COMPARISON:  Previous exam(s). ACR Breast Density Category b: There are scattered areas of fibroglandular  density. FINDINGS: There are no findings suspicious for malignancy. The images were evaluated with computer-aided detection. IMPRESSION: No mammographic evidence of malignancy. A result letter of this screening mamm

## 2021-09-14 ENCOUNTER — Telehealth: Payer: Self-pay | Admitting: Family Medicine

## 2021-09-14 DIAGNOSIS — E1169 Type 2 diabetes mellitus with other specified complication: Secondary | ICD-10-CM | POA: Insufficient documentation

## 2021-09-14 DIAGNOSIS — E782 Mixed hyperlipidemia: Secondary | ICD-10-CM

## 2021-09-14 LAB — COMPREHENSIVE METABOLIC PANEL
AG Ratio: 1.4 (calc) (ref 1.0–2.5)
ALT: 29 U/L (ref 6–29)
AST: 21 U/L (ref 10–35)
Albumin: 4.2 g/dL (ref 3.6–5.1)
Alkaline phosphatase (APISO): 69 U/L (ref 37–153)
BUN: 11 mg/dL (ref 7–25)
CO2: 18 mmol/L — ABNORMAL LOW (ref 20–32)
Calcium: 9.2 mg/dL (ref 8.6–10.4)
Chloride: 106 mmol/L (ref 98–110)
Creat: 0.78 mg/dL (ref 0.50–1.03)
Globulin: 2.9 g/dL (calc) (ref 1.9–3.7)
Glucose, Bld: 126 mg/dL — ABNORMAL HIGH (ref 65–99)
Potassium: 4.3 mmol/L (ref 3.5–5.3)
Sodium: 141 mmol/L (ref 135–146)
Total Bilirubin: 0.4 mg/dL (ref 0.2–1.2)
Total Protein: 7.1 g/dL (ref 6.1–8.1)

## 2021-09-14 LAB — HEMOGLOBIN A1C
Hgb A1c MFr Bld: 6.7 % of total Hgb — ABNORMAL HIGH (ref <5.7)
Mean Plasma Glucose: 146 mg/dL
eAG (mmol/L): 8.1 mmol/L

## 2021-09-14 LAB — CBC
HCT: 45.3 % — ABNORMAL HIGH (ref 35.0–45.0)
Hemoglobin: 15.6 g/dL — ABNORMAL HIGH (ref 11.7–15.5)
MCH: 30.3 pg (ref 27.0–33.0)
MCHC: 34.4 g/dL (ref 32.0–36.0)
MCV: 88 fL (ref 80.0–100.0)
MPV: 11.3 fL (ref 7.5–12.5)
Platelets: 338 10*3/uL (ref 140–400)
RBC: 5.15 10*6/uL — ABNORMAL HIGH (ref 3.80–5.10)
RDW: 13 % (ref 11.0–15.0)
WBC: 8.4 10*3/uL (ref 3.8–10.8)

## 2021-09-14 LAB — HEPATITIS C ANTIBODY
Hepatitis C Ab: NONREACTIVE
SIGNAL TO CUT-OFF: 0.12 (ref <1.00)

## 2021-09-14 LAB — TSH: TSH: 2.06 mIU/L

## 2021-09-14 MED ORDER — ATORVASTATIN CALCIUM 20 MG PO TABS: 20.0000 mg | ORAL_TABLET | Freq: Every day | ORAL | 3 refills | Status: DC

## 2021-09-14 MED ORDER — METFORMIN HCL 500 MG PO TABS: 500.0000 mg | ORAL_TABLET | Freq: Every day | ORAL | 1 refills | Status: DC

## 2021-09-14 NOTE — Telephone Encounter (Signed)
Hepatitis C screening is negative.  This is a normal result.  This is the last lab to be completed ?

## 2021-09-14 NOTE — Telephone Encounter (Signed)
Please call patient ?Liver, kidney and thyroid function are normal ?Blood cell counts are stable and electrolytes are normal ?Diabetes screening/A1c is 6.7> this is in the diabetic range.  I have called in metformin for her to start once daily with breakfast.  Metformin does not cause low sugars it works with the liver on sugar storage.  Goal for her is closer to an A1c of 6. ?Cholesterol panel is significantly high with an LDL of 164, her goal should be less than 130 and closer to 100.>  I have called in a medication for her to take before bed called Lipitor/atorvastatin.  This helps lower cholesterol to her goal and also provides extra cardiovascular protection. ? ?Would advise her to obtain routine exercise and start a diet low in saturated fats, high in fiber and lean meats. ? ?Follow-up in 4 - 6 months advised. ?

## 2021-09-14 NOTE — Telephone Encounter (Signed)
Spoke with pt regarding labs and instructions.   

## 2021-09-15 NOTE — Telephone Encounter (Signed)
Spoke with pt regarding labs and instructions.   

## 2021-09-30 LAB — COLOGUARD: COLOGUARD: NEGATIVE

## 2021-12-30 ENCOUNTER — Other Ambulatory Visit: Payer: Self-pay | Admitting: Family Medicine

## 2022-03-19 ENCOUNTER — Other Ambulatory Visit: Payer: Self-pay | Admitting: Family Medicine

## 2022-04-14 ENCOUNTER — Ambulatory Visit
Admission: RE | Admit: 2022-04-14 | Discharge: 2022-04-14 | Disposition: A | Discharge status: Home / Self Care | Payer: No Typology Code available for payment source | Source: Ambulatory Visit | Attending: Family Medicine | Admitting: Family Medicine

## 2022-04-14 DIAGNOSIS — Z1231 Encounter for screening mammogram for malignant neoplasm of breast: Secondary | ICD-10-CM

## 2022-09-14 ENCOUNTER — Ambulatory Visit (INDEPENDENT_AMBULATORY_CARE_PROVIDER_SITE_OTHER): Payer: No Typology Code available for payment source | Admitting: Family Medicine

## 2022-09-14 ENCOUNTER — Encounter: Payer: Self-pay | Admitting: Family Medicine

## 2022-09-14 VITALS — BP 139/84 | HR 78 | Temp 98.2°F | Ht 61.81 in | Wt 211.6 lb

## 2022-09-14 DIAGNOSIS — E785 Hyperlipidemia, unspecified: Secondary | ICD-10-CM

## 2022-09-14 DIAGNOSIS — I10 Essential (primary) hypertension: Secondary | ICD-10-CM

## 2022-09-14 DIAGNOSIS — D582 Other hemoglobinopathies: Secondary | ICD-10-CM | POA: Diagnosis not present

## 2022-09-14 DIAGNOSIS — E1169 Type 2 diabetes mellitus with other specified complication: Secondary | ICD-10-CM

## 2022-09-14 DIAGNOSIS — Z23 Encounter for immunization: Secondary | ICD-10-CM

## 2022-09-14 DIAGNOSIS — F419 Anxiety disorder, unspecified: Secondary | ICD-10-CM

## 2022-09-14 DIAGNOSIS — Z1231 Encounter for screening mammogram for malignant neoplasm of breast: Secondary | ICD-10-CM

## 2022-09-14 DIAGNOSIS — Z0000 Encounter for general adult medical examination without abnormal findings: Secondary | ICD-10-CM

## 2022-09-14 LAB — CBC WITH DIFFERENTIAL/PLATELET
Basophils Absolute: 0.1 10*3/uL (ref 0.0–0.1)
Basophils Relative: 0.7 % (ref 0.0–3.0)
Eosinophils Absolute: 0.2 10*3/uL (ref 0.0–0.7)
Eosinophils Relative: 2.4 % (ref 0.0–5.0)
HCT: 44.5 % (ref 36.0–46.0)
Hemoglobin: 15 g/dL (ref 12.0–15.0)
Lymphocytes Relative: 27.5 % (ref 12.0–46.0)
Lymphs Abs: 2.2 10*3/uL (ref 0.7–4.0)
MCHC: 33.6 g/dL (ref 30.0–36.0)
MCV: 90.1 fl (ref 78.0–100.0)
Monocytes Absolute: 0.5 10*3/uL (ref 0.1–1.0)
Monocytes Relative: 5.6 % (ref 3.0–12.0)
Neutro Abs: 5.1 10*3/uL (ref 1.4–7.7)
Neutrophils Relative %: 63.8 % (ref 43.0–77.0)
Platelets: 309 10*3/uL (ref 150.0–400.0)
RBC: 4.94 Mil/uL (ref 3.87–5.11)
RDW: 13.2 % (ref 11.5–15.5)
WBC: 8 10*3/uL (ref 4.0–10.5)

## 2022-09-14 LAB — LIPID PANEL
Cholesterol: 183 mg/dL (ref 0–200)
HDL: 44.9 mg/dL (ref >39.00)
LDL Cholesterol: 111 mg/dL — ABNORMAL HIGH (ref 0–99)
NonHDL: 138.52
Total CHOL/HDL Ratio: 4
Triglycerides: 137 mg/dL (ref 0.0–149.0)
VLDL: 27.4 mg/dL (ref 0.0–40.0)

## 2022-09-14 LAB — MICROALBUMIN / CREATININE URINE RATIO
Creatinine,U: 159.2 mg/dL
Microalb Creat Ratio: 1.6 mg/g (ref 0.0–30.0)
Microalb, Ur: 2.6 mg/dL — ABNORMAL HIGH (ref 0.0–1.9)

## 2022-09-14 LAB — COMPREHENSIVE METABOLIC PANEL
ALT: 26 U/L (ref 0–35)
AST: 18 U/L (ref 0–37)
Albumin: 4.3 g/dL (ref 3.5–5.2)
Alkaline Phosphatase: 69 U/L (ref 39–117)
BUN: 11 mg/dL (ref 6–23)
CO2: 29 mEq/L (ref 19–32)
Calcium: 9.2 mg/dL (ref 8.4–10.5)
Chloride: 103 mEq/L (ref 96–112)
Creatinine, Ser: 0.73 mg/dL (ref 0.40–1.20)
GFR: 94.14 mL/min (ref >60.00)
Glucose, Bld: 120 mg/dL — ABNORMAL HIGH (ref 70–99)
Potassium: 4.2 mEq/L (ref 3.5–5.1)
Sodium: 142 mEq/L (ref 135–145)
Total Bilirubin: 0.5 mg/dL (ref 0.2–1.2)
Total Protein: 6.7 g/dL (ref 6.0–8.3)

## 2022-09-14 LAB — TSH: TSH: 1.85 u[IU]/mL (ref 0.35–5.50)

## 2022-09-14 LAB — HEMOGLOBIN A1C: Hgb A1c MFr Bld: 7.5 % — ABNORMAL HIGH (ref 4.6–6.5)

## 2022-09-14 MED ORDER — ATORVASTATIN CALCIUM 20 MG PO TABS: 20.0000 mg | ORAL_TABLET | Freq: Every day | ORAL | 3 refills | Status: DC

## 2022-09-14 MED ORDER — ESCITALOPRAM OXALATE 10 MG PO TABS: 10.0000 mg | ORAL_TABLET | Freq: Every day | ORAL | 1 refills | Status: DC

## 2022-09-14 MED ORDER — METFORMIN HCL 500 MG PO TABS: ORAL_TABLET | ORAL | 1 refills | Status: DC

## 2022-09-14 MED ORDER — LOSARTAN POTASSIUM 25 MG PO TABS: 12.5000 mg | ORAL_TABLET | Freq: Every day | ORAL | 1 refills | Status: DC

## 2022-09-14 NOTE — Progress Notes (Signed)
Patient ID: Maria [name], female  DOB: 08-28-1970, 52 y.o.   MRN: [id number] Patient Care Team    Relationship Specialty Notifications Start End  Natalia Leatherwood, DO PCP - General Family Medicine  03/31/17   Berkley Harvey, MD Consulting Physician Glaucoma Ophthalmology  03/31/17   Jerrell Mylar, MD Consulting Physician Gynecology  03/31/17     Chief Complaint  Patient presents with   Annual Exam    With routine chronic condition management    Subjective: Maria [name] is a 52 y.o.  Female  present for CPE and routine chronic condition management All past medical history, surgical history, allergies, family history, immunizations, medications and social history were updated in the electronic medical record today. All recent labs, ED visits and hospitalizations within the last year were reviewed.  Health maintenance:  Colonoscopy: Cologuard completed 09/17/2021-normal Mammogram:FHX present in mother. completed 04/19/2022 [facility]> ordered for 2024 Cervical cancer screening: h/o hysterectomy Immunizations: tdap UTD 2015, covid completed, flu shot UTD-2023.  Shingrix No. 2- completed series in [place], pneumonia 20 declined Infectious disease screening: Hep c completed  Patient has a Dental home. Hospitalizations/ED visits: reviewed  Diabetes with hyperlipidemia: Diagnosed with diabetes 2023 with an A1c of 6.7 Pt reports non-compliance with metformin 500 mg daily. Denies numbness, tingling of extremities, hypo/hyperglycemic events or non-healing wounds.   Hypertension: She had had elevated BP in the past and was on amlodipine 2.5 mg daily.  This was discontinued last visit since her pressures with normal without.  If needing BP coverage again would consider ACE or ARB for diabetic history. Weight has remained stable  Anxiety: Patient reports she has been noting more irritability recently.  She states she is noted to be quick to anger, but her irritability has been even  less.  At one time she was taking Lexapro 10 mg daily, and she felt it worked well for her.  She states she thinks she would like to get back on that medicine today.    09/14/2022    9:53 AM 09/11/2021    1:08 PM 06/26/2019    8:35 AM 12/26/2018    1:03 PM 12/26/2018    1:02 PM  Depression screen PHQ 2/9  Decreased Interest 1 0 0 0 0  Down, Depressed, Hopeless 1 0 0 0 0  PHQ - 2 Score 2 0 0 0 0  Altered sleeping 1  0 0   Tired, decreased energy 1  0 0   Change in appetite 1  0 0   Feeling bad or failure about yourself  1  0 0   Trouble concentrating 1  0 1   Moving slowly or fidgety/restless 0  0 0   Suicidal thoughts 0  0 0   PHQ-9 Score 7  0 1   Difficult doing work/chores   Not difficult at all Not difficult at all       09/14/2022    9:54 AM 06/26/2019    8:36 AM 12/26/2018    1:02 PM 05/15/2018    2:49 PM  GAD 7 : Generalized Anxiety Score  Nervous, Anxious, on Edge 3 0 0 1  Control/stop worrying 3 0 0 1  Worry too much - different things 3 0 0 1  Trouble relaxing 3 0 1 1  Restless 3 0 0 1  Easily annoyed or irritable 3 1 0 1  Afraid - awful might happen 0 0 0 3  Total GAD 7 Score 18 1 1  9  Anxiety Difficulty  Not difficult at all Not difficult at all Not difficult at all      Immunization History  Administered Date(s) Administered   Influenza Split 04/17/2012   Influenza,inj,Quad PF,6+ Mos 03/28/2014, 01/26/2016, 03/29/2018, 02/27/2019, 05/01/2021   Influenza-Unspecified 03/16/2017, 03/29/2018, 02/27/2019   PFIZER(Purple Top)SARS-COV-2 Vaccination 08/24/2019, 09/18/2019   Pfizer Covid-19 Vaccine Bivalent Booster 43yrs & up 05/01/2021   Tdap 04/30/2014   Zoster Recombinat (Shingrix) 09/11/2021, 02/28/2022    Past Medical History:  Diagnosis Date   Abnormal mammogram of left breast 12/29/2018   01/10/2019: ADDENDUM: Pathology revealed FIBROCYSTIC CHANGE, INCLUDING FIBRO-ADENOMATOID CHANGE AND DYSTROPHIC CALCIFICATIONS of the LEFT breast, lower outer quadrant. This  was found to be concordant by Dr. Baird Lyons.   The patient was instructed to return for annual screening mammography and informed a reminder notice would be sent regarding this appointment.   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   Left eye   Endometriosis 11/10/2015   History of thyroid nodule 2011   Hypertension    Radicular pain of lower extremity 12/26/2018   Vaginal prolapse 2017   Allergies  Allergen Reactions   Oxycodone-Acetaminophen Anaphylaxis   Asa [Aspirin] Nausea And Vomiting   Past Surgical History:  Procedure Laterality Date   OOPHORECTOMY Right 07/23/15   Dr Huntley Dec   surgical breast clip Left 01/05/2019   TOTAL ABDOMINAL HYSTERECTOMY  10/2015   BSO also; endometriosis   Family History  Problem Relation Age of Onset   Hyperlipidemia Maternal Grandmother    Hypertension Maternal Grandmother    Diabetes Maternal Grandfather    Psoriasis Maternal Grandfather    Heart disease Maternal Grandfather    Hyperlipidemia Maternal Grandfather    Hypertension Maternal Grandfather    Heart disease Paternal Grandfather    Breast cancer Mother 39   Prostate cancer Father    Social History   Social History Narrative   Married with 2 children.   Smoke alarm in the home, wears her seatbelt.   Regular exercise:  Walks once a week   Caffeine use:  1-2 cups coffee daily   She works as a Agricultural engineer.    She has an associates degree.   Enjoys crafting/crocheting/Nascar   Feels safe in her relationships.    Allergies as of 09/14/2022       Reactions   Oxycodone-acetaminophen Anaphylaxis   Asa [aspirin] Nausea And Vomiting        Medication List        Accurate as of September 14, 2022  9:57 AM. If you have any questions, ask your nurse or doctor.          STOP taking these medications    B-12 1000 MCG Tabs Stopped by: Felix Pacini, DO       TAKE these medications    atorvastatin 20 MG tablet Commonly known as: LIPITOR Take 1  tablet (20 mg total) by mouth daily.   BIOTIN PO Take by mouth.   metFORMIN 500 MG tablet Commonly known as: GLUCOPHAGE TAKE 1 TABLET BY MOUTH EVERY DAY WITH BREAKFAST   multivitamin capsule Take 1 capsule by mouth daily.        All past medical history, surgical history, allergies, family history, immunizations andmedications were updated in the EMR today and reviewed under the history and medication portions of their EMR.     No results found for this or any previous visit (from the past 2160 hour(s)).   ROS 14 pt review of systems  performed and negative (unless mentioned in an HPI)  Objective: BP 139/84   Pulse 78   Temp 98.2 F (36.8 C)   Ht 5' 1.81" (1.57 m)   Wt 211 lb 9.6 oz (96 kg)   LMP 05/28/2015 (Exact Date)   SpO2 96%   BMI 38.94 kg/m  Physical Exam Vitals and nursing note reviewed.  Constitutional:      General: She is not in acute distress.    Appearance: Normal appearance. She is obese. She is not ill-appearing or toxic-appearing.  HENT:     Head: Normocephalic and atraumatic.     Right Ear: Tympanic membrane, ear canal and external ear normal. There is no impacted cerumen.     Left Ear: Tympanic membrane, ear canal and external ear normal. There is no impacted cerumen.     Nose: No congestion or rhinorrhea.     Mouth/Throat:     Mouth: Mucous membranes are moist.     Pharynx: Oropharynx is clear. No oropharyngeal exudate or posterior oropharyngeal erythema.  Eyes:     General: No scleral icterus.       Right eye: No discharge.        Left eye: No discharge.     Extraocular Movements: Extraocular movements intact.     Conjunctiva/sclera: Conjunctivae normal.     Pupils: Pupils are equal, round, and reactive to light.  Cardiovascular:     Rate and Rhythm: Normal rate and regular rhythm.     Pulses: Normal pulses.     Heart sounds: Normal heart sounds. No murmur heard.    No friction rub. No gallop.  Pulmonary:     Effort: Pulmonary effort is  normal. No respiratory distress.     Breath sounds: Normal breath sounds. No stridor. No wheezing, rhonchi or rales.  Chest:     Chest wall: No tenderness.  Abdominal:     General: Abdomen is flat. Bowel sounds are normal. There is no distension.     Palpations: Abdomen is soft. There is no mass.     Tenderness: There is no abdominal tenderness. There is no right CVA tenderness, left CVA tenderness, guarding or rebound.     Hernia: No hernia is present.  Musculoskeletal:        General: No swelling, tenderness or deformity. Normal range of motion.     Cervical back: Normal range of motion and neck supple. No rigidity or tenderness.     Right lower leg: No edema.     Left lower leg: No edema.  Lymphadenopathy:     Cervical: No cervical adenopathy.  Skin:    General: Skin is warm and dry.     Coloration: Skin is not jaundiced or pale.     Findings: No bruising, erythema, lesion or rash.  Neurological:     General: No focal deficit present.     Mental Status: She is alert and oriented to person, place, and time. Mental status is at baseline.     Cranial Nerves: No cranial nerve deficit.     Sensory: No sensory deficit.     Motor: No weakness.     Coordination: Coordination normal.     Gait: Gait normal.     Deep Tendon Reflexes: Reflexes normal.  Psychiatric:        Mood and Affect: Mood normal.        Behavior: Behavior normal.        Thought Content: Thought content normal.  Judgment: Judgment normal.     Diabetic Foot Exam - Simple   Simple Foot Form Diabetic Foot exam was performed with the following findings: Yes 09/14/2022  9:44 AM  Visual Inspection No deformities, no ulcerations, no other skin breakdown bilaterally: Yes Sensation Testing Intact to touch and monofilament testing bilaterally: Yes Pulse Check Posterior Tibialis and Dorsalis pulse intact bilaterally: Yes Comments     No results found.  Assessment/plan: Maria [name] is a 52 y.o. female present  for CPE and routine chronic conditions Routine general medical examination at a health care facility Patient was encouraged to exercise greater than 150 minutes a week. Patient was encouraged to choose a diet filled with fresh fruits and vegetables, and lean meats. AVS provided to patient today for education/recommendation on gender specific health and safety maintenance. Colonoscopy: Cologuard completed 09/17/2021-normal Mammogram:FHX present in mother. completed 04/19/2022 [facility]> ordered for 2024 Cervical cancer screening: h/o hysterectomy Immunizations: tdap UTD 2015, covid completed, flu shot UTD-2023.  Shingrix No. 2 completed in [place].  Pneumonia 20 declined Infectious disease screening: Hep c completed   Anxiety: More irritable per patient. Restart Lexapro 10 mg daily  Hypertension: Above goal. Goal<130/80 Start losartan 12.5 mg daily Breast cancer screening by mammogram - MM 3D SCREENING MAMMOGRAM BILATERAL BREAST; Future Type 2 diabetes mellitus with hyperlipidemia Restart metformin 500 mg daily, dose will be altered if appropriate after A1c is received. PNA series: Declined Flu shot:  (recommneded yearly) Foot exam: Completed 09/14/2022 Urine microalbumin: Completed 09/14/2022 Eye exam: Completed 07/2022 in [place], attempting to obtain records A1c: 6.7> collected today - CBC with Differential/Platelet - Comprehensive metabolic panel - Hemoglobin A1c - Lipid panel - TSH - Urine Microalbumin w/creat. ratio  Elevated hemoglobin - CBC with Differential/Platelet Need for pneumonia vaccine Patient declined today   Return in about 24 weeks (around 03/01/2023) for Routine chronic condition follow-up.   Orders Placed This Encounter  Procedures   MM 3D SCREENING MAMMOGRAM BILATERAL BREAST   CBC with Differential/Platelet   Comprehensive metabolic panel   Hemoglobin A1c   Lipid panel   TSH   Urine Microalbumin w/creat. ratio   Meds ordered this encounter   Medications   atorvastatin (LIPITOR) 20 MG tablet    Sig: Take 1 tablet (20 mg total) by mouth daily.    Dispense:  90 tablet    Refill:  3   metFORMIN (GLUCOPHAGE) 500 MG tablet    Sig: TAKE 1 TABLET BY MOUTH EVERY DAY WITH BREAKFAST    Dispense:  90 tablet    Refill:  1   Referral Orders  No referral(s) requested today     Electronically signed by: Felix Pacini, DO [facility] [place]

## 2022-09-14 NOTE — Patient Instructions (Addendum)
Return in about 24 weeks (around 03/01/2023) for Routine chronic condition follow-up.        Great to see you today.  I have refilled the medication(s) we provide.   If labs were collected, we will inform you of lab results once received either by echart message or telephone call.   - echart message- for normal results that have been seen by the patient already.   - telephone call: abnormal results or if patient has not viewed results in their echart.  If you are interested in weight loss counseling please make appt to discuss and bring with you 2 weeks of a food diary/log on notebook paper.  Food log is mandatory on first appt to proceed with counseling.  Weight loss counseling encompasses diet, exercise and can include  medications when appropriate and affordable.  There are routine appts for check-ins and weights to track progress and keep you on track.  Routine check-ins (in person) are also mandatory to continue with prescription refills. Check-in timeline  can range from 4 weeks to 12 weeks, depending on physician's recommendations and which step you are in of your weight loss journey.  Your BMI today is Body mass index is 38.94 kg/m.  Please check with your insurance prior to appt and ask them if they cover weight loss medications for your BMI? And if so, which medications. They may tell you some of the diabetes meds that are used for weight loss also,  are on your formulary- but this does not mean they are covered for weight loss only.  Even if they tell with a prior auth it is covered- make sure they check to see if you personally meet criteria with your BMI.

## 2022-09-16 ENCOUNTER — Telehealth: Payer: Self-pay | Admitting: Family Medicine

## 2022-09-16 MED ORDER — TIRZEPATIDE 2.5 MG/0.5ML ~~LOC~~ SOAJ: 2.5000 mg | SUBCUTANEOUS | 0 refills | Status: DC

## 2022-09-16 MED ORDER — TIRZEPATIDE 5 MG/0.5ML ~~LOC~~ SOAJ: 5.0000 mg | SUBCUTANEOUS | 1 refills | Status: DC

## 2022-09-16 NOTE — Telephone Encounter (Signed)
Spoke with patient regarding results/recommendations.  

## 2022-09-16 NOTE — Telephone Encounter (Signed)
Please call patient: Liver, kidney and thyroid function are normal. Cholesterol is above goal for diabetic.  LDL should be less than 70, hers is above 100 currently.  I would encourage her to restart her cholesterol medicine which works best if taken before bed. Blood cell counts are normal Urine protein levels are normal Her A1c increased, now 7.5.  Would encourage her to restart the metformin and I also have called and the once weekly injection called Mounjaro.  This is a medication that she used for to treat diabetes, but also can help with weight loss which she showed interest in at her last appointment. -It is a once weekly injection that starts at [facility] 2.5 mg weekly, then increases after 4 weeks to 5 mg weekly.  Follow-up in 3 months on diabetes.  If she would like a nurse visit for medication administration proper technique, please schedule this for her.

## 2022-10-07 ENCOUNTER — Other Ambulatory Visit: Payer: Self-pay | Admitting: Family Medicine

## 2022-10-12 ENCOUNTER — Encounter: Payer: Self-pay | Admitting: Family Medicine

## 2022-10-12 ENCOUNTER — Other Ambulatory Visit: Payer: Self-pay | Admitting: Family Medicine

## 2022-10-12 ENCOUNTER — Telehealth: Payer: Self-pay | Admitting: Family Medicine

## 2022-10-12 NOTE — Telephone Encounter (Signed)
My Chart message sent

## 2022-10-12 NOTE — Telephone Encounter (Signed)
Patient is having trouble finding the 5 mg at her pharmacy and would like to continue on the 2.5 mg of mounjaro. Please advise patient if possible.

## 2022-10-12 NOTE — Telephone Encounter (Signed)
Please have patient check the pharmacy is connected to the [facility] for [facility] in stock.  Please give her these locations.

## 2022-10-13 MED ORDER — TIRZEPATIDE 2.5 MG/0.5ML ~~LOC~~ SOAJ: 2.5000 mg | SUBCUTANEOUS | 0 refills | Status: DC

## 2022-10-13 NOTE — Telephone Encounter (Signed)
Called in a refill on the Mounjaro [facility].  I did leave the 5 mg prescription active that she can hopefully refill next time.

## 2022-11-09 ENCOUNTER — Other Ambulatory Visit: Payer: Self-pay | Admitting: Family Medicine

## 2022-11-15 ENCOUNTER — Other Ambulatory Visit: Payer: Self-pay | Admitting: Family Medicine

## 2022-11-15 ENCOUNTER — Encounter: Payer: Self-pay | Admitting: Family Medicine

## 2022-11-16 MED ORDER — TIRZEPATIDE 2.5 MG/0.5ML ~~LOC~~ SOAJ: 2.5000 mg | SUBCUTANEOUS | 0 refills | Status: DC

## 2022-11-16 NOTE — Telephone Encounter (Signed)
Yes. Ok to refill lower dose for now

## 2022-11-16 NOTE — Addendum Note (Signed)
Addended by: Filomena Jungling on: 11/16/2022 10:28 AM   Modules accepted: Orders

## 2022-12-27 ENCOUNTER — Ambulatory Visit (INDEPENDENT_AMBULATORY_CARE_PROVIDER_SITE_OTHER): Payer: No Typology Code available for payment source | Admitting: Family Medicine

## 2022-12-27 ENCOUNTER — Encounter: Payer: Self-pay | Admitting: Family Medicine

## 2022-12-27 VITALS — BP 138/80 | HR 66 | Temp 98.0°F | Wt 194.8 lb

## 2022-12-27 DIAGNOSIS — E785 Hyperlipidemia, unspecified: Secondary | ICD-10-CM | POA: Diagnosis not present

## 2022-12-27 DIAGNOSIS — F419 Anxiety disorder, unspecified: Secondary | ICD-10-CM

## 2022-12-27 DIAGNOSIS — Z7985 Long-term (current) use of injectable non-insulin antidiabetic drugs: Secondary | ICD-10-CM

## 2022-12-27 DIAGNOSIS — I10 Essential (primary) hypertension: Secondary | ICD-10-CM | POA: Diagnosis not present

## 2022-12-27 DIAGNOSIS — E1169 Type 2 diabetes mellitus with other specified complication: Secondary | ICD-10-CM | POA: Diagnosis not present

## 2022-12-27 LAB — POCT GLYCOSYLATED HEMOGLOBIN (HGB A1C)
HbA1c POC (<> result, manual entry): 6.3 % (ref 4.0–5.6)
HbA1c, POC (controlled diabetic range): 6.3 % (ref 0.0–7.0)
HbA1c, POC (prediabetic range): 6.3 % (ref 5.7–6.4)
Hemoglobin A1C: 6.3 % — AB (ref 4.0–5.6)

## 2022-12-27 MED ORDER — TIRZEPATIDE 7.5 MG/0.5ML ~~LOC~~ SOAJ
7.5000 mg | SUBCUTANEOUS | 1 refills | Status: DC
Start: 2022-12-27 — End: 2023-07-05

## 2022-12-27 MED ORDER — ESCITALOPRAM OXALATE 10 MG PO TABS: 10.0000 mg | ORAL_TABLET | Freq: Every day | ORAL | 1 refills | Status: DC

## 2022-12-27 MED ORDER — LOSARTAN POTASSIUM 25 MG PO TABS: 25.0000 mg | ORAL_TABLET | Freq: Every day | ORAL | 1 refills | Status: DC

## 2022-12-27 NOTE — Progress Notes (Signed)
Patient ID: Maria [name], female  DOB: [birth year], 52 y.o.   MRN: [id number] Patient Care Team    Relationship Specialty Notifications Start End  Natalia Leatherwood, DO PCP - General Family Medicine  03/31/17   Berkley Harvey, MD Consulting Physician Glaucoma Ophthalmology  03/31/17   Jerrell Mylar, MD Consulting Physician Gynecology  03/31/17     Chief Complaint  Patient presents with   Diabetes    Is having a problem getting 5 mg mounjaro in [place] but was able to get it out of state currently has 3 pens left    Subjective: Maria [name] is a 52 y.o.  Female  present for routine chronic condition management All past medical history, surgical history, allergies, family history, immunizations, medications and social history were updated in the electronic medical record today. All recent labs, ED visits and hospitalizations within the last year were reviewed.  Diabetes with hyperlipidemia: Diagnosed with diabetes 2023 with an A1c of 6.7 Pt reports non-compliance with metformin 500 mg daily.pt reports compliance with Mounjaro 5 mg every day, although she is having trouble finding that dose in stock.  Patient denies dizziness, hyperglycemic or hypoglycemic events. Patient denies numbness, tingling in the extremities or nonhealing wounds of feet.   Hypertension: She had had elevated BP in the past and was on amlodipine 2.5 mg daily.  This was discontinued last visit since her pressures with normal without.  If needing BP coverage again would consider ACE or ARB for diabetic history.   Anxiety: Patient reports Lexapro 10 mg daily is working well for her. Prior note: Patient reports she has been noting more irritability recently.  She states she is noted to be quick to anger, but her irritability has been even less.  At one time she was taking Lexapro 10 mg daily, and she felt it worked well for her.  She states she thinks she would like to get back on that medicine today.     09/14/2022    9:53 AM 09/11/2021    1:08 PM 06/26/2019    8:35 AM 12/26/2018    1:03 PM 12/26/2018    1:02 PM  Depression screen PHQ 2/9  Decreased Interest 1 0 0 0 0  Down, Depressed, Hopeless 1 0 0 0 0  PHQ - 2 Score 2 0 0 0 0  Altered sleeping 1  0 0   Tired, decreased energy 1  0 0   Change in appetite 1  0 0   Feeling bad or failure about yourself  1  0 0   Trouble concentrating 1  0 1   Moving slowly or fidgety/restless 0  0 0   Suicidal thoughts 0  0 0   PHQ-9 Score 7  0 1   Difficult doing work/chores   Not difficult at all Not difficult at all       09/14/2022    9:54 AM 06/26/2019    8:36 AM 12/26/2018    1:02 PM 05/15/2018    2:49 PM  GAD 7 : Generalized Anxiety Score  Nervous, Anxious, on Edge 3 0 0 1  Control/stop worrying 3 0 0 1  Worry too much - different things 3 0 0 1  Trouble relaxing 3 0 1 1  Restless 3 0 0 1  Easily annoyed or irritable 3 1 0 1  Afraid - awful might happen 0 0 0 3  Total GAD 7 Score 18 1 1 9   Anxiety Difficulty  Not difficult at all Not difficult at all Not difficult at all      Immunization History  Administered Date(s) Administered   Influenza Split 04/17/2012   Influenza,inj,Quad PF,6+ Mos 03/28/2014, 01/26/2016, 03/29/2018, 02/27/2019, 05/01/2021   Influenza-Unspecified 03/16/2017, 03/29/2018, 02/27/2019   PFIZER(Purple Top)SARS-COV-2 Vaccination 08/24/2019, 09/18/2019   Pfizer Covid-19 Vaccine Bivalent Booster 80yrs & up 05/01/2021   Tdap 04/30/2014   Zoster Recombinant(Shingrix) 09/11/2021, 02/28/2022    Past Medical History:  Diagnosis Date   Abnormal mammogram of left breast 12/29/2018   01/10/2019: ADDENDUM: Pathology revealed FIBROCYSTIC CHANGE, INCLUDING FIBRO-ADENOMATOID CHANGE AND DYSTROPHIC CALCIFICATIONS of the LEFT breast, lower outer quadrant. This was found to be concordant by Dr. Baird Lyons.   The patient was instructed to return for annual screening mammography and informed a reminder notice would be sent regarding  this appointment.   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   Left eye   Endometriosis 11/10/2015   History of thyroid nodule 2011   Hypertension    Radicular pain of lower extremity 12/26/2018   Vaginal prolapse 2017   Allergies  Allergen Reactions   Oxycodone-Acetaminophen Anaphylaxis   Asa [Aspirin] Nausea And Vomiting   Past Surgical History:  Procedure Laterality Date   OOPHORECTOMY Right 07/23/15   Dr Huntley Dec   surgical breast clip Left 01/05/2019   TOTAL ABDOMINAL HYSTERECTOMY  10/2015   BSO also; endometriosis   Family History  Problem Relation Age of Onset   Hyperlipidemia Maternal Grandmother    Hypertension Maternal Grandmother    Diabetes Maternal Grandfather    Psoriasis Maternal Grandfather    Heart disease Maternal Grandfather    Hyperlipidemia Maternal Grandfather    Hypertension Maternal Grandfather    Heart disease Paternal Grandfather    Breast cancer Mother 67   Prostate cancer Father    Social History   Social History Narrative   Married with 2 children.   Smoke alarm in the home, wears her seatbelt.   Regular exercise:  Walks once a week   Caffeine use:  1-2 cups coffee daily   She works as a Agricultural engineer.    She has an associates degree.   Enjoys crafting/crocheting/Nascar   Feels safe in her relationships.    Allergies as of 12/27/2022       Reactions   Oxycodone-acetaminophen Anaphylaxis   Asa [aspirin] Nausea And Vomiting        Medication List        Accurate as of December 27, 2022  9:16 AM. If you have any questions, ask your nurse or doctor.          STOP taking these medications    metFORMIN 500 MG tablet Commonly known as: GLUCOPHAGE Stopped by: Felix Pacini   tirzepatide 2.5 MG/0.5ML Pen Commonly known as: MOUNJARO Stopped by: Felix Pacini   tirzepatide 5 MG/0.5ML Pen Commonly known as: MOUNJARO Replaced by: tirzepatide 7.5 MG/0.5ML Pen Stopped by: Felix Pacini       TAKE these  medications    atorvastatin 20 MG tablet Commonly known as: LIPITOR Take 1 tablet (20 mg total) by mouth daily.   BIOTIN PO Take by mouth.   escitalopram 10 MG tablet Commonly known as: Lexapro Take 1 tablet (10 mg total) by mouth daily.   losartan 25 MG tablet Commonly known as: COZAAR Take 1 tablet (25 mg total) by mouth daily. What changed: how much to take Changed by: Felix Pacini   multivitamin capsule Take 1 capsule by mouth  daily.   tirzepatide 7.5 MG/0.5ML Pen Commonly known as: MOUNJARO Inject 7.5 mg into the skin once a week. Replaces: tirzepatide 5 MG/0.5ML Pen Started by: Felix Pacini        All past medical history, surgical history, allergies, family history, immunizations andmedications were updated in the EMR today and reviewed under the history and medication portions of their EMR.     Recent Results (from the past 2160 hour(s))  POCT glycosylated hemoglobin (Hb A1C)     Status: Abnormal   Collection Time: 12/27/22  9:05 AM  Result Value Ref Range   Hemoglobin A1C 6.3 (A) 4.0 - 5.6 %   HbA1c POC (<> result, manual entry) 6.3 4.0 - 5.6 %   HbA1c, POC (prediabetic range) 6.3 5.7 - 6.4 %   HbA1c, POC (controlled diabetic range) 6.3 0.0 - 7.0 %     ROS 14 pt review of systems performed and negative (unless mentioned in an HPI)  Objective: BP 138/80   Pulse 66   Temp 98 F (36.7 C)   Wt 194 lb 12.8 oz (88.4 kg)   LMP 05/28/2015 (Exact Date)   SpO2 98%   BMI 35.85 kg/m  Physical Exam Vitals and nursing note reviewed.  Constitutional:      General: She is not in acute distress.    Appearance: Normal appearance. She is obese. She is not ill-appearing, toxic-appearing or diaphoretic.  HENT:     Head: Normocephalic and atraumatic.  Eyes:     General: No scleral icterus.       Right eye: No discharge.        Left eye: No discharge.     Extraocular Movements: Extraocular movements intact.     Conjunctiva/sclera: Conjunctivae normal.      Pupils: Pupils are equal, round, and reactive to light.  Cardiovascular:     Rate and Rhythm: Normal rate and regular rhythm.  Pulmonary:     Effort: Pulmonary effort is normal. No respiratory distress.     Breath sounds: Normal breath sounds. No wheezing, rhonchi or rales.  Musculoskeletal:     Right lower leg: No edema.     Left lower leg: No edema.  Skin:    General: Skin is warm.     Findings: No rash.  Neurological:     Mental Status: She is alert and oriented to person, place, and time. Mental status is at baseline.     Motor: No weakness.     Gait: Gait normal.  Psychiatric:        Mood and Affect: Mood normal.        Behavior: Behavior normal.        Thought Content: Thought content normal.        Judgment: Judgment normal.      No results found.  Assessment/plan: Maria [name] is a 52 y.o. female present for routine chronic conditions Anxiety: Stable Continue Lexapro 10 mg daily  Hypertension: Above goal despite start of losartan  Goal<130/80 increase losartan 12.5 > 25 mg daily Continue lipitor 20 mg qhs  Type 2 diabetes mellitus with hyperlipidemia DC metformin Increase Mounjaro to 7.5 mg weekly PNA series: Declined Flu shot:  (recommneded yearly) Foot exam: Completed 09/14/2022 Urine microalbumin: Completed 09/14/2022 Eye exam: Completed 07/2022 in [place], attempting to obtain records A1c: 6.7>7.5> 6.3 collected today   Return in about 24 weeks (around 06/13/2023).   Orders Placed This Encounter  Procedures   POCT glycosylated hemoglobin (Hb A1C)   Meds ordered this encounter  Medications   escitalopram (LEXAPRO) 10 MG tablet    Sig: Take 1 tablet (10 mg total) by mouth daily.    Dispense:  90 tablet    Refill:  1   losartan (COZAAR) 25 MG tablet    Sig: Take 1 tablet (25 mg total) by mouth daily.    Dispense:  90 tablet    Refill:  1   tirzepatide (MOUNJARO) 7.5 MG/0.5ML Pen    Sig: Inject 7.5 mg into the skin once a week.    Dispense:  6  mL    Refill:  1    If dispensing 1 month at time, please provide 5 refills.   Referral Orders  No referral(s) requested today     Electronically signed by: Felix Pacini, DO [facility] [place]

## 2022-12-27 NOTE — Patient Instructions (Addendum)
Return in about 24 weeks (around 06/13/2023).        Great to see you today.  I have refilled the medication(s) we provide.   If labs were collected or images ordered, we will inform you of  results once we have received them and reviewed. We will contact you either by echart message, or telephone call.  Please give ample time to the testing facility, and our office to run,  receive and review results. Please do not call inquiring of results, even if you can see them in your chart. We will contact you as soon as we are able. If it has been over 1 week since the test was completed, and you have not yet heard from [place], then please call us.    - echart message- for normal results that have been seen by the patient already.   - telephone call: abnormal results or if patient has not viewed results in their echart.  If a referral to a specialist was entered for you, please call us in 2 weeks if you have not heard from the specialist office to schedule.

## 2023-01-14 ENCOUNTER — Encounter: Payer: Self-pay | Admitting: Family Medicine

## 2023-01-14 ENCOUNTER — Telehealth (INDEPENDENT_AMBULATORY_CARE_PROVIDER_SITE_OTHER): Payer: No Typology Code available for payment source | Admitting: Family Medicine

## 2023-01-14 ENCOUNTER — Telehealth: Payer: Self-pay

## 2023-01-14 DIAGNOSIS — U071 COVID-19: Secondary | ICD-10-CM

## 2023-01-14 MED ORDER — MOLNUPIRAVIR EUA 200MG CAPSULE: 4.0000 | ORAL_CAPSULE | Freq: Two times a day (BID) | ORAL | 0 refills | Status: DC

## 2023-01-14 MED ORDER — DOXYCYCLINE HYCLATE 100 MG PO TABS: 100.0000 mg | ORAL_TABLET | Freq: Two times a day (BID) | ORAL | 0 refills | Status: DC

## 2023-01-14 MED ORDER — NIRMATRELVIR/RITONAVIR (PAXLOVID)TABLET: 3.0000 | ORAL_TABLET | Freq: Two times a day (BID) | ORAL | 0 refills | Status: AC

## 2023-01-14 NOTE — Telephone Encounter (Signed)
Insurance is not covering current Rx. Please advise on Paxlovid which insurance will cover. Rx pending

## 2023-01-14 NOTE — Telephone Encounter (Signed)
Spoke with patient regarding results/recommendations.  

## 2023-01-14 NOTE — Telephone Encounter (Signed)
I have called in Paxlovid for patient to start.  Please advise her to stop atorvastatin for 10 days, then restart.

## 2023-01-14 NOTE — Telephone Encounter (Signed)
Patient husband called, patient had video visit today with Dr. Claiborne Billings.  Patient was prescribed molnupiravir EUA (LAGEVRIO) 200 mg CAPS capsule .  Patient's insurance will not cover.  They do cover Paxlovid.  [facility] [place] [address]

## 2023-01-14 NOTE — Progress Notes (Signed)
VIRTUAL VISIT VIA VIDEO  I connected with Maria [name] on 01/14/23 at 11:20 AM EDT by a video enabled telemedicine application and verified that I am speaking with the correct person using two identifiers. Location patient: Home Location provider: [facility], Office Persons participating in the virtual visit: Patient, Dr. Claiborne Billings and Ivonne Andrew, CMA  I discussed the limitations of evaluation and management by telemedicine and the availability of in person appointments. The patient expressed understanding and agreed to proceed.     Maria [name] , 1970-08-26, 52 y.o., female MRN: [id number] Patient Care Team    Relationship Specialty Notifications Start End  Natalia Leatherwood, DO PCP - General Family Medicine  03/31/17   Berkley Harvey, MD Consulting Physician Glaucoma Ophthalmology  03/31/17   Jerrell Mylar, MD Consulting Physician Gynecology  03/31/17     Chief Complaint  Patient presents with   Covid Positive    Tested + this morning; HA, chills, congestion     Subjective: Maria [name] is a 52 y.o. Pt presents for an OV with complaints of headache, chills, congestion of 1 day duration.  Pt tested positive for covid this morning.  Husband positive earlier this week.  Pt has tried tylenol to ease their symptoms.      12/27/2022   11:41 AM 09/14/2022    9:53 AM 09/11/2021    1:08 PM 06/26/2019    8:35 AM 12/26/2018    1:03 PM  Depression screen PHQ 2/9  Decreased Interest 0 1 0 0 0  Down, Depressed, Hopeless 0 1 0 0 0  PHQ - 2 Score 0 2 0 0 0  Altered sleeping  1  0 0  Tired, decreased energy  1  0 0  Change in appetite  1  0 0  Feeling bad or failure about yourself   1  0 0  Trouble concentrating  1  0 1  Moving slowly or fidgety/restless  0  0 0  Suicidal thoughts  0  0 0  PHQ-9 Score  7  0 1  Difficult doing work/chores    Not difficult at all Not difficult at all    Allergies  Allergen Reactions   Oxycodone-Acetaminophen Anaphylaxis   Asa  [Aspirin] Nausea And Vomiting   Social History   Social History Narrative   Married with 2 children.   Smoke alarm in the home, wears her seatbelt.   Regular exercise:  Walks once a week   Caffeine use:  1-2 cups coffee daily   She works as a Agricultural engineer.    She has an associates degree.   Enjoys crafting/crocheting/Nascar   Feels safe in her relationships.   Past Medical History:  Diagnosis Date   Abnormal mammogram of left breast 12/29/2018   01/10/2019: ADDENDUM: Pathology revealed FIBROCYSTIC CHANGE, INCLUDING FIBRO-ADENOMATOID CHANGE AND DYSTROPHIC CALCIFICATIONS of the LEFT breast, lower outer quadrant. This was found to be concordant by Dr. Baird Lyons.   The patient was instructed to return for annual screening mammography and informed a reminder notice would be sent regarding this appointment.   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   Left eye   Endometriosis 11/10/2015   History of thyroid nodule 2011   Hypertension    Radicular pain of lower extremity 12/26/2018   Vaginal prolapse 2017   Past Surgical History:  Procedure Laterality Date   OOPHORECTOMY Right 07/23/15   Dr Huntley Dec   surgical breast clip  Left 01/05/2019   TOTAL ABDOMINAL HYSTERECTOMY  10/2015   BSO also; endometriosis   Family History  Problem Relation Age of Onset   Hyperlipidemia Maternal Grandmother    Hypertension Maternal Grandmother    Diabetes Maternal Grandfather    Psoriasis Maternal Grandfather    Heart disease Maternal Grandfather    Hyperlipidemia Maternal Grandfather    Hypertension Maternal Grandfather    Heart disease Paternal Grandfather    Breast cancer Mother 21   Prostate cancer Father    Allergies as of 01/14/2023       Reactions   Oxycodone-acetaminophen Anaphylaxis   Asa [aspirin] Nausea And Vomiting        Medication List        Accurate as of January 14, 2023 11:22 AM. If you have any questions, ask your nurse or doctor.           atorvastatin 20 MG tablet Commonly known as: LIPITOR Take 1 tablet (20 mg total) by mouth daily.   BIOTIN PO Take by mouth.   doxycycline 100 MG tablet Commonly known as: VIBRA-TABS Take 1 tablet (100 mg total) by mouth 2 (two) times daily. Started by: Felix Pacini   escitalopram 10 MG tablet Commonly known as: Lexapro Take 1 tablet (10 mg total) by mouth daily.   losartan 25 MG tablet Commonly known as: COZAAR Take 1 tablet (25 mg total) by mouth daily.   molnupiravir EUA 200 mg Caps capsule Commonly known as: LAGEVRIO Take 4 capsules (800 mg total) by mouth 2 (two) times daily for 5 days. Started by: Felix Pacini   multivitamin capsule Take 1 capsule by mouth daily.   tirzepatide 7.5 MG/0.5ML Pen Commonly known as: MOUNJARO Inject 7.5 mg into the skin once a week.        All past medical history, surgical history, allergies, family history, immunizations andmedications were updated in the EMR today and reviewed under the history and medication portions of their EMR.     Review of Systems  Constitutional:  Positive for chills and malaise/fatigue. Negative for fever.  HENT:  Positive for congestion. Negative for ear pain, sinus pain and sore throat.   Respiratory:  Negative for cough, sputum production, shortness of breath and wheezing.   Gastrointestinal:  Positive for nausea. Negative for constipation, diarrhea and vomiting.  Musculoskeletal:  Positive for myalgias.  Skin:  Negative for rash.  Neurological:  Positive for headaches. Negative for dizziness.   Negative, with the exception of above mentioned in HPI   Objective:  LMP 05/28/2015 (Exact Date)  There is no height or weight on file to calculate BMI. Physical Exam Vitals and nursing note reviewed.  Constitutional:      General: She is not in acute distress.    Appearance: Normal appearance. She is obese. She is ill-appearing. She is not toxic-appearing or diaphoretic.     Comments: Laying in bed   HENT:     Head: Normocephalic and atraumatic.  Eyes:     General: No scleral icterus.       Right eye: No discharge.        Left eye: No discharge.     Conjunctiva/sclera: Conjunctivae normal.  Pulmonary:     Effort: Pulmonary effort is normal.  Musculoskeletal:     Cervical back: Normal range of motion.  Skin:    Findings: No rash.  Neurological:     Mental Status: She is alert and oriented to person, place, and time. Mental status is at baseline.  Psychiatric:        Mood and Affect: Mood normal.        Behavior: Behavior normal.        Thought Content: Thought content normal.        Judgment: Judgment normal.      No results found. No results found. No results found for this or any previous visit (from the past 24 hour(s)).  Assessment/Plan: Tais Basch is a 52 y.o. female present for OV for  COVID-19 Rest, hydrate.  Continue mucinex (DM if cough), nettie pot or nasal saline.  molnupiravir prescribed, take until completed.  Doxy BID prescribed to start only  of symptoms rebound or worsen after 7 days  F/U 2 weeks of not improved.  Reviewed home care instructions for COVID. Advised self-isolation at home for at least 5 days. After 5 days, if improved and fever resolved, can be in public, but should wear a mask around others for an additional 5 days. If symptoms, esp, dyspnea develops/worsens, recommend in-person evaluation at either an urgent care or the emergency room.   Reviewed expectations re: course of current medical issues. Discussed self-management of symptoms. Outlined signs and symptoms indicating need for more acute intervention. Patient verbalized understanding and all questions were answered. Patient received an After-Visit Summary.    No orders of the defined types were placed in this encounter.  Meds ordered this encounter  Medications   molnupiravir EUA (LAGEVRIO) 200 mg CAPS capsule    Sig: Take 4 capsules (800 mg total) by mouth 2 (two) times  daily for 5 days.    Dispense:  40 capsule    Refill:  0   doxycycline (VIBRA-TABS) 100 MG tablet    Sig: Take 1 tablet (100 mg total) by mouth 2 (two) times daily.    Dispense:  20 tablet    Refill:  0   Referral Orders  No referral(s) requested today     Note is dictated utilizing voice recognition software. Although note has been proof read prior to signing, occasional typographical errors still can be missed. If any questions arise, please do not hesitate to call for verification.   electronically signed by:  Felix Pacini, DO  [facility] [place]

## 2023-03-01 ENCOUNTER — Encounter: Payer: Self-pay | Admitting: Family Medicine

## 2023-03-01 ENCOUNTER — Ambulatory Visit: Payer: No Typology Code available for payment source | Admitting: Family Medicine

## 2023-03-01 VITALS — BP 120/82 | HR 75 | Temp 97.8°F | Wt 192.0 lb

## 2023-03-01 DIAGNOSIS — Z7985 Long-term (current) use of injectable non-insulin antidiabetic drugs: Secondary | ICD-10-CM

## 2023-03-01 DIAGNOSIS — F419 Anxiety disorder, unspecified: Secondary | ICD-10-CM | POA: Diagnosis not present

## 2023-03-01 DIAGNOSIS — E1169 Type 2 diabetes mellitus with other specified complication: Secondary | ICD-10-CM

## 2023-03-01 DIAGNOSIS — Z23 Encounter for immunization: Secondary | ICD-10-CM

## 2023-03-01 DIAGNOSIS — E785 Hyperlipidemia, unspecified: Secondary | ICD-10-CM

## 2023-03-01 DIAGNOSIS — I10 Essential (primary) hypertension: Secondary | ICD-10-CM

## 2023-03-01 MED ORDER — TIRZEPATIDE 10 MG/0.5ML ~~LOC~~ SOAJ
10.0000 mg | SUBCUTANEOUS | 3 refills | Status: DC
Start: 2023-03-01 — End: 2023-07-05

## 2023-03-01 MED ORDER — LOSARTAN POTASSIUM 25 MG PO TABS: 25.0000 mg | ORAL_TABLET | Freq: Every day | ORAL | 1 refills | Status: DC

## 2023-03-01 NOTE — Patient Instructions (Addendum)
Appt in Feb.        Great to see you today.  I have refilled the medication(s) we provide.   If labs were collected or images ordered, we will inform you of  results once we have received them and reviewed. We will contact you either by echart message, or telephone call.  Please give ample time to the testing facility, and our office to run,  receive and review results. Please do not call inquiring of results, even if you can see them in your chart. We will contact you as soon as we are able. If it has been over 1 week since the test was completed, and you have not yet heard from [place], then please call us.    - echart message- for normal results that have been seen by the patient already.   - telephone call: abnormal results or if patient has not viewed results in their echart.  If a referral to a specialist was entered for you, please call us in 2 weeks if you have not heard from the specialist office to schedule.

## 2023-03-01 NOTE — Progress Notes (Signed)
Patient ID: Maria [name], female  DOB: 14-Jul-1970, 52 y.o.   MRN: [id number] Patient Care Team    Relationship Specialty Notifications Start End  Natalia Leatherwood, DO PCP - General Family Medicine  03/31/17   Berkley Harvey, MD Consulting Physician Glaucoma Ophthalmology  03/31/17   Jerrell Mylar, MD Consulting Physician Gynecology  03/31/17     Chief Complaint  Patient presents with   Hypertension    [facility] [place]    Subjective: Maria [name] is a 52 y.o.  Female  present for routine chronic condition management All past medical history, surgical history, allergies, family history, immunizations, medications and social history were updated in the electronic medical record today. All recent labs, ED visits and hospitalizations within the last year were reviewed.  Diabetes with hyperlipidemia: Diagnosed with diabetes 2023 with an A1c of 6.7 patient reports compliance with Mounjaro 7.5 mg weekly. Patient denies dizziness, hyperglycemic or hypoglycemic events. Patient denies numbness, tingling in the extremities or nonhealing wounds of feet.   Hypertension: Patient reports compliance with losartan 25 mg daily.Patient denies chest pain, shortness of breath, dizziness or lower extremity edema.   Anxiety: Patient compliance with Lexapro 10 mg daily is working well for her. Prior note: Patient reports she has been noting more irritability recently.  She states she is noted to be quick to anger, but her irritability has been even less.  At one time she was taking Lexapro 10 mg daily, and she felt it worked well for her.  She states she thinks she would like to get back on that medicine today.    03/01/2023    7:48 AM 12/27/2022   11:41 AM 09/14/2022    9:53 AM 09/11/2021    1:08 PM 06/26/2019    8:35 AM  Depression screen PHQ 2/9  Decreased Interest 0 0 1 0 0  Down, Depressed, Hopeless 0 0 1 0 0  PHQ - 2 Score 0 0 2 0 0  Altered sleeping   1  0  Tired, decreased  energy   1  0  Change in appetite   1  0  Feeling bad or failure about yourself    1  0  Trouble concentrating   1  0  Moving slowly or fidgety/restless   0  0  Suicidal thoughts   0  0  PHQ-9 Score   7  0  Difficult doing work/chores     Not difficult at all      03/01/2023    7:48 AM 09/14/2022    9:54 AM 06/26/2019    8:36 AM 12/26/2018    1:02 PM  GAD 7 : Generalized Anxiety Score  Nervous, Anxious, on Edge 1 3 0 0  Control/stop worrying 1 3 0 0  Worry too much - different things 1 3 0 0  Trouble relaxing 1 3 0 1  Restless 1 3 0 0  Easily annoyed or irritable 1 3 1  0  Afraid - awful might happen 0 0 0 0  Total GAD 7 Score 6 18 1 1   Anxiety Difficulty Not difficult at all  Not difficult at all Not difficult at all      Immunization History  Administered Date(s) Administered   Influenza Split 04/17/2012   Influenza, Seasonal, Injecte, Preservative Fre 03/01/2023   Influenza,inj,Quad PF,6+ Mos 03/28/2014, 01/26/2016, 03/29/2018, 02/27/2019, 05/01/2021   Influenza-Unspecified 03/16/2017, 03/29/2018, 02/27/2019   PFIZER(Purple Top)SARS-COV-2 Vaccination 08/24/2019, 09/18/2019   Pfizer Covid-19 Vaccine  Bivalent Booster 88yrs & up 05/01/2021   Tdap 04/30/2014   Zoster Recombinant(Shingrix) 09/11/2021, 02/28/2022    Past Medical History:  Diagnosis Date   Abnormal mammogram of left breast 12/29/2018   01/10/2019: ADDENDUM: Pathology revealed FIBROCYSTIC CHANGE, INCLUDING FIBRO-ADENOMATOID CHANGE AND DYSTROPHIC CALCIFICATIONS of the LEFT breast, lower outer quadrant. This was found to be concordant by Dr. Baird Lyons.   The patient was instructed to return for annual screening mammography and informed a reminder notice would be sent regarding this appointment.   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   Left eye   Endometriosis 11/10/2015   History of thyroid nodule 2011   Hypertension    Radicular pain of lower extremity 12/26/2018   Vaginal prolapse 2017    Allergies  Allergen Reactions   Oxycodone-Acetaminophen Anaphylaxis   Asa [Aspirin] Nausea And Vomiting   Past Surgical History:  Procedure Laterality Date   OOPHORECTOMY Right 07/23/15   Dr Huntley Dec   surgical breast clip Left 01/05/2019   TOTAL ABDOMINAL HYSTERECTOMY  10/2015   BSO also; endometriosis   Family History  Problem Relation Age of Onset   Hyperlipidemia Maternal Grandmother    Hypertension Maternal Grandmother    Diabetes Maternal Grandfather    Psoriasis Maternal Grandfather    Heart disease Maternal Grandfather    Hyperlipidemia Maternal Grandfather    Hypertension Maternal Grandfather    Heart disease Paternal Grandfather    Breast cancer Mother 18   Prostate cancer Father    Social History   Social History Narrative   Married with 2 children.   Smoke alarm in the home, wears her seatbelt.   Regular exercise:  Walks once a week   Caffeine use:  1-2 cups coffee daily   She works as a Agricultural engineer.    She has an associates degree.   Enjoys crafting/crocheting/Nascar   Feels safe in her relationships.    Allergies as of 03/01/2023       Reactions   Oxycodone-acetaminophen Anaphylaxis   Asa [aspirin] Nausea And Vomiting        Medication List        Accurate as of March 01, 2023 10:21 AM. If you have any questions, ask your nurse or doctor.          STOP taking these medications    doxycycline 100 MG tablet Commonly known as: VIBRA-TABS Stopped by: Felix Pacini       TAKE these medications    atorvastatin 20 MG tablet Commonly known as: LIPITOR Take 1 tablet (20 mg total) by mouth daily.   BIOTIN PO Take by mouth.   escitalopram 10 MG tablet Commonly known as: Lexapro Take 1 tablet (10 mg total) by mouth daily.   losartan 25 MG tablet Commonly known as: COZAAR Take 1 tablet (25 mg total) by mouth daily.   multivitamin capsule Take 1 capsule by mouth daily.   tirzepatide 7.5 MG/0.5ML Pen Commonly known as:  MOUNJARO Inject 7.5 mg into the skin once a week. What changed: Another medication with the same name was added. Make sure you understand how and when to take each. Changed by: Felix Pacini   tirzepatide 10 MG/0.5ML Pen Commonly known as: MOUNJARO Inject 10 mg into the skin once a week. What changed: You were already taking a medication with the same name, and this prescription was added. Make sure you understand how and when to take each. Changed by: Felix Pacini  All past medical history, surgical history, allergies, family history, immunizations andmedications were updated in the EMR today and reviewed under the history and medication portions of their EMR.     Recent Results (from the past 2160 hour(s))  POCT glycosylated hemoglobin (Hb A1C)     Status: Abnormal   Collection Time: 12/27/22  9:05 AM  Result Value Ref Range   Hemoglobin A1C 6.3 (A) 4.0 - 5.6 %   HbA1c POC (<> result, manual entry) 6.3 4.0 - 5.6 %   HbA1c, POC (prediabetic range) 6.3 5.7 - 6.4 %   HbA1c, POC (controlled diabetic range) 6.3 0.0 - 7.0 %     ROS 14 pt review of systems performed and negative (unless mentioned in an HPI)  Objective: BP 120/82   Pulse 75   Temp 97.8 F (36.6 C)   Wt 192 lb (87.1 kg)   LMP 05/28/2015 (Exact Date)   SpO2 97%   BMI 35.33 kg/m  Physical Exam Vitals and nursing note reviewed.  Constitutional:      General: She is not in acute distress.    Appearance: Normal appearance. She is obese. She is not ill-appearing, toxic-appearing or diaphoretic.  HENT:     Head: Normocephalic and atraumatic.  Eyes:     General: No scleral icterus.       Right eye: No discharge.        Left eye: No discharge.     Extraocular Movements: Extraocular movements intact.     Conjunctiva/sclera: Conjunctivae normal.     Pupils: Pupils are equal, round, and reactive to light.  Cardiovascular:     Rate and Rhythm: Normal rate and regular rhythm.  Pulmonary:     Effort:  Pulmonary effort is normal. No respiratory distress.     Breath sounds: Normal breath sounds. No wheezing, rhonchi or rales.  Musculoskeletal:     Right lower leg: No edema.     Left lower leg: No edema.  Skin:    General: Skin is warm.     Findings: No rash.  Neurological:     Mental Status: She is alert and oriented to person, place, and time. Mental status is at baseline.     Motor: No weakness.     Gait: Gait normal.  Psychiatric:        Mood and Affect: Mood normal.        Behavior: Behavior normal.        Thought Content: Thought content normal.        Judgment: Judgment normal.      No results found.  Assessment/plan: Geniva Lohnes is a 52 y.o. female present for routine chronic conditions Anxiety: Stable continue Lexapro 10 mg daily  Hypertension: At goal < 130/80. Goal<130/80 Continue Losartan 25 mg daily Continue lipitor 20 mg qhs  Type 2 diabetes mellitus with hyperlipidemia Stable Increase Mounjaro 10 mg weekly PNA series: Declined Flu shot:completed today (recommneded yearly) Foot exam: Completed 09/14/2022 Urine microalbumin: Completed 09/14/2022 Eye exam: Completed 07/2022 in [place], attempting to obtain records A1c: 6.7>7.5> 6.3 due next visit  Feb appt. Reccommended.    Orders Placed This Encounter  Procedures   Flu vaccine trivalent PF, 6mos and older(Flulaval,Afluria,Fluarix,Fluzone)   Meds ordered this encounter  Medications   tirzepatide (MOUNJARO) 10 MG/0.5ML Pen    Sig: Inject 10 mg into the skin once a week.    Dispense:  6 mL    Refill:  3    Dc lower dose, needed increase   losartan (  COZAAR) 25 MG tablet    Sig: Take 1 tablet (25 mg total) by mouth daily.    Dispense:  90 tablet    Refill:  1   Referral Orders  No referral(s) requested today     Electronically signed by: Felix Pacini, DO [facility] [place]

## 2023-03-10 ENCOUNTER — Encounter: Payer: Self-pay | Admitting: Family Medicine

## 2023-03-10 ENCOUNTER — Other Ambulatory Visit: Payer: Self-pay | Admitting: Family Medicine

## 2023-03-10 NOTE — Telephone Encounter (Signed)
Nothing further needed at this time. 

## 2023-05-26 ENCOUNTER — Ambulatory Visit
Admission: RE | Admit: 2023-05-26 | Discharge: 2023-05-26 | Disposition: A | Discharge status: Home / Self Care | Payer: No Typology Code available for payment source | Source: Ambulatory Visit | Attending: Family Medicine | Admitting: Family Medicine

## 2023-05-26 DIAGNOSIS — Z1231 Encounter for screening mammogram for malignant neoplasm of breast: Secondary | ICD-10-CM

## 2023-06-09 ENCOUNTER — Ambulatory Visit

## 2023-06-14 ENCOUNTER — Ambulatory Visit: Payer: No Typology Code available for payment source | Admitting: Family Medicine

## 2023-07-05 ENCOUNTER — Encounter: Payer: Self-pay | Admitting: Family Medicine

## 2023-07-05 ENCOUNTER — Ambulatory Visit: Payer: No Typology Code available for payment source | Admitting: Family Medicine

## 2023-07-05 VITALS — BP 132/80 | HR 68 | Temp 98.2°F | Wt 190.6 lb

## 2023-07-05 DIAGNOSIS — D582 Other hemoglobinopathies: Secondary | ICD-10-CM

## 2023-07-05 DIAGNOSIS — I10 Essential (primary) hypertension: Secondary | ICD-10-CM | POA: Diagnosis not present

## 2023-07-05 DIAGNOSIS — B372 Candidiasis of skin and nail: Secondary | ICD-10-CM | POA: Insufficient documentation

## 2023-07-05 DIAGNOSIS — Z23 Encounter for immunization: Secondary | ICD-10-CM

## 2023-07-05 DIAGNOSIS — R0683 Snoring: Secondary | ICD-10-CM

## 2023-07-05 DIAGNOSIS — E1169 Type 2 diabetes mellitus with other specified complication: Secondary | ICD-10-CM | POA: Diagnosis not present

## 2023-07-05 DIAGNOSIS — E785 Hyperlipidemia, unspecified: Secondary | ICD-10-CM | POA: Diagnosis not present

## 2023-07-05 DIAGNOSIS — R400 Somnolence: Secondary | ICD-10-CM

## 2023-07-05 DIAGNOSIS — F419 Anxiety disorder, unspecified: Secondary | ICD-10-CM

## 2023-07-05 HISTORY — DX: Snoring: R06.83

## 2023-07-05 LAB — CBC
HCT: 44.4 % (ref 36.0–46.0)
Hemoglobin: 14.5 g/dL (ref 12.0–15.0)
MCHC: 32.6 g/dL (ref 30.0–36.0)
MCV: 92.5 fL (ref 78.0–100.0)
Platelets: 318 10*3/uL (ref 150.0–400.0)
RBC: 4.8 Mil/uL (ref 3.87–5.11)
RDW: 13.3 % (ref 11.5–15.5)
WBC: 8.2 10*3/uL (ref 4.0–10.5)

## 2023-07-05 LAB — COMPREHENSIVE METABOLIC PANEL
ALT: 28 U/L (ref 0–35)
AST: 18 U/L (ref 0–37)
Albumin: 4.2 g/dL (ref 3.5–5.2)
Alkaline Phosphatase: 78 U/L (ref 39–117)
BUN: 12 mg/dL (ref 6–23)
CO2: 28 meq/L (ref 19–32)
Calcium: 9.1 mg/dL (ref 8.4–10.5)
Chloride: 106 meq/L (ref 96–112)
Creatinine, Ser: 0.88 mg/dL (ref 0.40–1.20)
GFR: 74.81 mL/min (ref >60.00)
Glucose, Bld: 115 mg/dL — ABNORMAL HIGH (ref 70–99)
Potassium: 4.8 meq/L (ref 3.5–5.1)
Sodium: 143 meq/L (ref 135–145)
Total Bilirubin: 0.6 mg/dL (ref 0.2–1.2)
Total Protein: 6.7 g/dL (ref 6.0–8.3)

## 2023-07-05 LAB — LIPID PANEL
Cholesterol: 140 mg/dL (ref 0–200)
HDL: 42.7 mg/dL (ref >39.00)
LDL Cholesterol: 75 mg/dL (ref 0–99)
NonHDL: 97.44
Total CHOL/HDL Ratio: 3
Triglycerides: 112 mg/dL (ref 0.0–149.0)
VLDL: 22.4 mg/dL (ref 0.0–40.0)

## 2023-07-05 LAB — MICROALBUMIN / CREATININE URINE RATIO
Creatinine,U: 188.4 mg/dL
Microalb Creat Ratio: 0.6 mg/g (ref 0.0–30.0)
Microalb, Ur: 1.2 mg/dL (ref 0.0–1.9)

## 2023-07-05 LAB — POCT GLYCOSYLATED HEMOGLOBIN (HGB A1C)
HbA1c POC (<> result, manual entry): 6.1 % (ref 4.0–5.6)
HbA1c, POC (controlled diabetic range): 6.1 % (ref 0.0–7.0)
HbA1c, POC (prediabetic range): 6.1 % (ref 5.7–6.4)
Hemoglobin A1C: 6.1 % — AB (ref 4.0–5.6)

## 2023-07-05 LAB — TSH: TSH: 2.51 u[IU]/mL (ref 0.35–5.50)

## 2023-07-05 MED ORDER — TIRZEPATIDE 10 MG/0.5ML ~~LOC~~ SOAJ: 10.0000 mg | SUBCUTANEOUS | 5 refills | Status: DC

## 2023-07-05 MED ORDER — LOSARTAN POTASSIUM 25 MG PO TABS: 25.0000 mg | ORAL_TABLET | Freq: Every day | ORAL | 1 refills | Status: DC

## 2023-07-05 MED ORDER — CLOTRIMAZOLE 1 % EX CREA: 1.0000 | TOPICAL_CREAM | Freq: Two times a day (BID) | CUTANEOUS | 1 refills | Status: AC

## 2023-07-05 MED ORDER — ATORVASTATIN CALCIUM 20 MG PO TABS: 20.0000 mg | ORAL_TABLET | Freq: Every day | ORAL | 3 refills | Status: DC

## 2023-07-05 MED ORDER — ESCITALOPRAM OXALATE 10 MG PO TABS: 10.0000 mg | ORAL_TABLET | Freq: Every day | ORAL | 1 refills | Status: DC

## 2023-07-05 NOTE — Patient Instructions (Addendum)

## 2023-07-05 NOTE — Progress Notes (Signed)
 Patient ID: Maria [name], female  DOB: Jun 22, 1970, 53 y.o.   MRN: [id number] Patient Care Team    Relationship Specialty Notifications Start End  Catherine Charlies LABOR, DO PCP - General Family Medicine  03/31/17   Renate Lynwood Hussar, MD Consulting Physician Glaucoma Ophthalmology  03/31/17   Alvia Dorothyann LABOR, MD Consulting Physician Gynecology  03/31/17     Chief Complaint  Patient presents with   Medical Management of Chronic Issues    Pt is fasting.     Subjective: Maria [name] is a 53 y.o.  Female  present for chronic condition management All past medical history, surgical history, allergies, family history, immunizations, medications and social history were updated in the electronic medical record today. All recent labs, ED visits and hospitalizations within the last year were reviewed.  Diabetes with hyperlipidemia: Diagnosed with diabetes 2023 with an A1c of 6.7 patient reports compliance with Mounjaro  10 mg weekly.Patient denies dizziness, hyperglycemic or hypoglycemic events. Patient denies numbness, tingling in the extremities or nonhealing wounds of feet.   Hypertension: Patient reports compliance with losartan  25 mg daily. Patient denies chest pain, shortness of breath, dizziness or lower extremity edema.   Anxiety: Patient compliance with Lexapro  10 mg daily is working well for her. Prior note: Patient reports she has been noting more irritability recently.  She states she is noted to be quick to anger, but her irritability has been even less.  At one time she was taking Lexapro  10 mg daily, and she felt it worked well for her.  She states she thinks she would like to get back on that medicine today.    03/01/2023    7:48 AM 12/27/2022   11:41 AM 09/14/2022    9:53 AM 09/11/2021    1:08 PM 06/26/2019    8:35 AM  Depression screen PHQ 2/9  Decreased Interest 0 0 1 0 0  Down, Depressed, Hopeless 0 0 1 0 0  PHQ - 2 Score 0 0 2 0 0  Altered sleeping   1  0  Tired,  decreased energy   1  0  Change in appetite   1  0  Feeling bad or failure about yourself    1  0  Trouble concentrating   1  0  Moving slowly or fidgety/restless   0  0  Suicidal thoughts   0  0  PHQ-9 Score   7  0  Difficult doing work/chores     Not difficult at all      03/01/2023    7:48 AM 09/14/2022    9:54 AM 06/26/2019    8:36 AM 12/26/2018    1:02 PM  GAD 7 : Generalized Anxiety Score  Nervous, Anxious, on Edge 1 3 0 0  Control/stop worrying 1 3 0 0  Worry too much - different things 1 3 0 0  Trouble relaxing 1 3 0 1  Restless 1 3 0 0  Easily annoyed or irritable 1 3 1  0  Afraid - awful might happen 0 0 0 0  Total GAD 7 Score 6 18 1 1   Anxiety Difficulty Not difficult at all  Not difficult at all Not difficult at all    Immunization History  Administered Date(s) Administered   Influenza Split 04/17/2012   Influenza, Seasonal, Injecte, Preservative Fre 03/01/2023   Influenza,inj,Quad PF,6+ Mos 03/28/2014, 01/26/2016, 03/29/2018, 02/27/2019, 05/01/2021   Influenza-Unspecified 03/16/2017, 03/29/2018, 02/27/2019   PFIZER(Purple Top)SARS-COV-2 Vaccination 08/24/2019, 09/18/2019   PNEUMOCOCCAL CONJUGATE-20  07/05/2023   Pfizer Covid-19 Theatre Manager 71yrs & up 05/01/2021   Tdap 04/30/2014   Zoster Recombinant(Shingrix ) 09/11/2021, 02/28/2022    Past Medical History:  Diagnosis Date   Abnormal mammogram of left breast 12/29/2018   01/10/2019: ADDENDUM: Pathology revealed FIBROCYSTIC CHANGE, INCLUDING FIBRO-ADENOMATOID CHANGE AND DYSTROPHIC CALCIFICATIONS of the LEFT breast, lower outer quadrant. This was found to be concordant by Dr. Dina Arceo.   The patient was instructed to return for annual screening mammography and informed a reminder notice would be sent regarding this appointment.   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  No  further workup required 05/31/2009   Hypertension    Radicular pain of lower extremity 12/26/2018   Vaginal prolapse 2017   Allergies  Allergen Reactions   Oxycodone -Acetaminophen  Anaphylaxis   Asa [Aspirin] Nausea And Vomiting   Past Surgical History:  Procedure Laterality Date   BREAST BIOPSY Left 2020   OOPHORECTOMY Right 07/23/2015   Dr Evangeline   surgical breast clip Left 01/05/2019   TOTAL ABDOMINAL HYSTERECTOMY  10/2015   BSO also; endometriosis   Family History  Problem Relation Age of Onset   Hyperlipidemia Maternal Grandmother    Hypertension Maternal Grandmother    Diabetes Maternal Grandfather    Psoriasis Maternal Grandfather    Heart disease Maternal Grandfather    Hyperlipidemia Maternal Grandfather    Hypertension Maternal Grandfather    Heart disease Paternal Grandfather    Breast cancer Mother 90   Prostate cancer Father    Social History   Social History Narrative   Married with 2 children.   Smoke alarm in the home, wears her seatbelt.   Regular exercise:  Walks once a week   Caffeine use:  1-2 cups coffee daily   She works as a agricultural engineer.    She has an associates degree.   Enjoys crafting/crocheting/Nascar   Feels safe in her relationships.    Allergies as of 07/05/2023       Reactions   Oxycodone -acetaminophen  Anaphylaxis   Asa [aspirin] Nausea And Vomiting        Medication List        Accurate as of July 05, 2023  8:21 AM. If you have any questions, ask your nurse or doctor.          atorvastatin  20 MG tablet Commonly known as: LIPITOR Take 1 tablet (20 mg total) by mouth daily.   BIOTIN PO Take by mouth.   clotrimazole  1 % cream Commonly known as: Clotrimazole  Anti-Fungal Apply 1 Application topically 2 (two) times daily. To abdominal fold Started by: Charlies Bellini   escitalopram  10 MG tablet Commonly known as: Lexapro  Take 1 tablet (10 mg total) by mouth daily.   losartan  25 MG tablet Commonly known as:  COZAAR  Take 1 tablet (25 mg total) by mouth daily.   multivitamin capsule Take 1 capsule by mouth daily.   tirzepatide  10 MG/0.5ML Pen Commonly known as: MOUNJARO  Inject 10 mg into the skin once a week. What changed: Another medication with the same name was removed. Continue taking this medication, and follow the directions you see here. Changed by: Charlies Bellini        All past medical history, surgical history, allergies, family history, immunizations andmedications were updated in the EMR today and reviewed under the history and medication portions of their EMR.  Recent Results (from the past 2160 hours)  POCT HgB A1C     Status: Abnormal   Collection Time: 07/05/23  8:00 AM  Result Value Ref Range   Hemoglobin A1C 6.1 (A) 4.0 - 5.6 %   HbA1c POC (<> result, manual entry) 6.1 4.0 - 5.6 %   HbA1c, POC (prediabetic range) 6.1 5.7 - 6.4 %   HbA1c, POC (controlled diabetic range) 6.1 0.0 - 7.0 %      ROS 14 pt review of systems performed and negative (unless mentioned in an HPI)  Objective: BP 132/80   Pulse 68   Temp 98.2 F (36.8 C)   Wt 190 lb 9.6 oz (86.5 kg)   LMP 05/28/2015 (Exact Date)   SpO2 98%   BMI 35.07 kg/m  Physical Exam Vitals and nursing note reviewed.  Constitutional:      General: She is not in acute distress.    Appearance: Normal appearance. She is obese. She is not ill-appearing, toxic-appearing or diaphoretic.  HENT:     Head: Normocephalic and atraumatic.  Eyes:     General: No scleral icterus.       Right eye: No discharge.        Left eye: No discharge.     Extraocular Movements: Extraocular movements intact.     Conjunctiva/sclera: Conjunctivae normal.     Pupils: Pupils are equal, round, and reactive to light.  Cardiovascular:     Rate and Rhythm: Normal rate and regular rhythm.     Heart sounds: No murmur heard. Pulmonary:     Effort: Pulmonary effort is normal. No respiratory distress.     Breath sounds: Normal breath  sounds. No wheezing, rhonchi or rales.  Musculoskeletal:     Cervical back: Neck supple.     Right lower leg: No edema.     Left lower leg: No edema.  Skin:    General: Skin is warm.     Findings: Rash (abd fold and right groin) present.  Neurological:     Mental Status: She is alert and oriented to person, place, and time. Mental status is at baseline.     Motor: No weakness.     Gait: Gait normal.  Psychiatric:        Mood and Affect: Mood normal.        Behavior: Behavior normal.        Thought Content: Thought content normal.        Judgment: Judgment normal.     Diabetic Foot Exam - Simple   Simple Foot Form Diabetic Foot exam was performed with the following findings: Yes 07/05/2023  8:02 AM  Visual Inspection No deformities, no ulcerations, no other skin breakdown bilaterally: Yes Sensation Testing Intact to touch and monofilament testing bilaterally: Yes Pulse Check Posterior Tibialis and Dorsalis pulse intact bilaterally: Yes Comments     No results found.  Assessment/plan: Maria [name] is a 53 y.o. female present for  chronic condition management Anxiety: Stable Continue Lexapro  10 mg daily  Hypertension/obesity/hyperlipidemia: Stable Goal<130/80 Continue losartan  25 mg daily Continue lipitor 20 mg qhs  Type 2 diabetes mellitus with hyperlipidemia/obesity Stable Continue Mounjaro  10 mg weekly (mild-mod fatigue day after shot) PNA series: UTD 07/2023 Flu shot: UTD 2024 (recommneded yearly) Foot exam: Completed 07/05/2023 Urine microalbumin: Completed 2//2025 Eye exam: Completed 07/2022 in [place] , attempting to obtain records-reminded patient eye exam is due Lbs: 211>194>192>190 A1c: 6.7>7.5> 6.3 > 6.1 A1c collected today  Snoring and daytime somnolence: Sleep clinic referral  Orders Placed This Encounter  Procedures   Pneumococcal conjugate vaccine 20-valent   CBC   Comp Met (CMET)   TSH   Urine Microalbumin w/creat. ratio   Lipid panel    Ambulatory referral to Pulmonology   POCT HgB A1C   Meds ordered this encounter  Medications   atorvastatin  (LIPITOR) 20 MG tablet    Sig: Take 1 tablet (20 mg total) by mouth daily.    Dispense:  90 tablet    Refill:  3   losartan  (COZAAR ) 25 MG tablet    Sig: Take 1 tablet (25 mg total) by mouth daily.    Dispense:  90 tablet    Refill:  1   escitalopram  (LEXAPRO ) 10 MG tablet    Sig: Take 1 tablet (10 mg total) by mouth daily.    Dispense:  90 tablet    Refill:  1   tirzepatide  (MOUNJARO ) 10 MG/0.5ML Pen    Sig: Inject 10 mg into the skin once a week.    Dispense:  2 mL    Refill:  5   clotrimazole  (CLOTRIMAZOLE  ANTI-FUNGAL) 1 % cream    Sig: Apply 1 Application topically 2 (two) times daily. To abdominal fold    Dispense:  113 g    Refill:  1   Referral Orders         Ambulatory referral to Pulmonology       Electronically signed by: Charlies Bellini, DO [facility] [place]

## 2023-07-06 ENCOUNTER — Encounter: Payer: Self-pay | Admitting: Family Medicine

## 2023-07-07 ENCOUNTER — Telehealth: Payer: Self-pay

## 2023-07-07 ENCOUNTER — Other Ambulatory Visit (HOSPITAL_COMMUNITY): Payer: Self-pay

## 2023-07-07 NOTE — Telephone Encounter (Signed)
 Pharmacy Patient Advocate Encounter   Received notification from Physician's Office that prior authorization for Mounjaro  10MG /0.5ML auto-injectors  is required/requested.   Insurance verification completed.   The patient is insured through [facility] .   Per test claim: PA required; PA submitted to above mentioned insurance via CoverMyMeds Key/confirmation #/EOC (Key: [id number])  Status is pending

## 2023-07-08 ENCOUNTER — Other Ambulatory Visit (HOSPITAL_COMMUNITY): Payer: Self-pay

## 2023-07-08 NOTE — Telephone Encounter (Signed)
 Pharmacy Patient Advocate Encounter  Received notification from [facility] that Prior Authorization for Mounjaro  10MG /0.5ML auto-injectors  has been APPROVED from 07/07/23 to 07/06/26   PA #/Case ID/Reference #: [id number]

## 2023-07-12 NOTE — Telephone Encounter (Signed)
No further action needed. Pt notified

## 2023-10-19 ENCOUNTER — Encounter: Payer: No Typology Code available for payment source | Admitting: Family Medicine

## 2024-01-03 ENCOUNTER — Ambulatory Visit (INDEPENDENT_AMBULATORY_CARE_PROVIDER_SITE_OTHER): Admitting: Family Medicine

## 2024-01-03 ENCOUNTER — Encounter: Payer: Self-pay | Admitting: Family Medicine

## 2024-01-03 VITALS — BP 122/80 | HR 72 | Temp 98.1°F | Wt 185.2 lb

## 2024-01-03 DIAGNOSIS — F419 Anxiety disorder, unspecified: Secondary | ICD-10-CM | POA: Diagnosis not present

## 2024-01-03 DIAGNOSIS — Z23 Encounter for immunization: Secondary | ICD-10-CM

## 2024-01-03 DIAGNOSIS — R591 Generalized enlarged lymph nodes: Secondary | ICD-10-CM | POA: Diagnosis not present

## 2024-01-03 DIAGNOSIS — I10 Essential (primary) hypertension: Secondary | ICD-10-CM

## 2024-01-03 DIAGNOSIS — Z6834 Body mass index (BMI) 34.0-34.9, adult: Secondary | ICD-10-CM

## 2024-01-03 DIAGNOSIS — Z0000 Encounter for general adult medical examination without abnormal findings: Secondary | ICD-10-CM

## 2024-01-03 DIAGNOSIS — Z7985 Long-term (current) use of injectable non-insulin antidiabetic drugs: Secondary | ICD-10-CM

## 2024-01-03 DIAGNOSIS — Z1231 Encounter for screening mammogram for malignant neoplasm of breast: Secondary | ICD-10-CM

## 2024-01-03 DIAGNOSIS — Z0001 Encounter for general adult medical examination with abnormal findings: Secondary | ICD-10-CM | POA: Diagnosis not present

## 2024-01-03 DIAGNOSIS — E1169 Type 2 diabetes mellitus with other specified complication: Secondary | ICD-10-CM

## 2024-01-03 DIAGNOSIS — E785 Hyperlipidemia, unspecified: Secondary | ICD-10-CM

## 2024-01-03 DIAGNOSIS — D582 Other hemoglobinopathies: Secondary | ICD-10-CM

## 2024-01-03 LAB — MICROALBUMIN / CREATININE URINE RATIO
Creatinine,U: 56.4 mg/dL
Microalb Creat Ratio: UNDETERMINED mg/g (ref 0.0–30.0)
Microalb, Ur: <0.7 mg/dL

## 2024-01-03 LAB — LIPID PANEL
Cholesterol: 142 mg/dL (ref 0–200)
HDL: 50.5 mg/dL (ref >39.00)
LDL Cholesterol: 65 mg/dL (ref 0–99)
NonHDL: 91.1
Total CHOL/HDL Ratio: 3
Triglycerides: 130 mg/dL (ref 0.0–149.0)
VLDL: 26 mg/dL (ref 0.0–40.0)

## 2024-01-03 LAB — COMPREHENSIVE METABOLIC PANEL WITH GFR
ALT: 29 U/L (ref 0–35)
AST: 19 U/L (ref 0–37)
Albumin: 4.5 g/dL (ref 3.5–5.2)
Alkaline Phosphatase: 66 U/L (ref 39–117)
BUN: 11 mg/dL (ref 6–23)
CO2: 32 meq/L (ref 19–32)
Calcium: 9.4 mg/dL (ref 8.4–10.5)
Chloride: 103 meq/L (ref 96–112)
Creatinine, Ser: 0.79 mg/dL (ref 0.40–1.20)
GFR: 84.85 mL/min (ref >60.00)
Glucose, Bld: 90 mg/dL (ref 70–99)
Potassium: 4.4 meq/L (ref 3.5–5.1)
Sodium: 141 meq/L (ref 135–145)
Total Bilirubin: 0.4 mg/dL (ref 0.2–1.2)
Total Protein: 7.1 g/dL (ref 6.0–8.3)

## 2024-01-03 LAB — CBC
HCT: 45.4 % (ref 36.0–46.0)
Hemoglobin: 15.1 g/dL — ABNORMAL HIGH (ref 12.0–15.0)
MCHC: 33.1 g/dL (ref 30.0–36.0)
MCV: 89.2 fl (ref 78.0–100.0)
Platelets: 320 K/uL (ref 150.0–400.0)
RBC: 5.1 Mil/uL (ref 3.87–5.11)
RDW: 13.1 % (ref 11.5–15.5)
WBC: 10.1 K/uL (ref 4.0–10.5)

## 2024-01-03 LAB — HEMOGLOBIN A1C: Hgb A1c MFr Bld: 6.4 % (ref 4.6–6.5)

## 2024-01-03 MED ORDER — LOSARTAN POTASSIUM 25 MG PO TABS: 25.0000 mg | ORAL_TABLET | Freq: Every day | ORAL | 1 refills | Status: AC

## 2024-01-03 MED ORDER — ESCITALOPRAM OXALATE 10 MG PO TABS: 10.0000 mg | ORAL_TABLET | Freq: Every day | ORAL | 1 refills | Status: AC

## 2024-01-03 MED ORDER — ATORVASTATIN CALCIUM 20 MG PO TABS: 20.0000 mg | ORAL_TABLET | Freq: Every day | ORAL | 3 refills | Status: AC

## 2024-01-03 MED ORDER — TIRZEPATIDE 10 MG/0.5ML ~~LOC~~ SOAJ: 10.0000 mg | SUBCUTANEOUS | 5 refills | Status: DC

## 2024-01-03 NOTE — Progress Notes (Signed)
 Patient ID: Maria [name], female  DOB: 11/13/1970, 53 y.o.   MRN: [id number] Patient Care Team    Relationship Specialty Notifications Start End  Catherine Charlies LABOR, DO PCP - General Family Medicine  03/31/17   Renate Lynwood Hussar, MD Consulting Physician Glaucoma Ophthalmology  03/31/17   Alvia Dorothyann LABOR, MD Consulting Physician Gynecology  03/31/17     Chief Complaint  Patient presents with   Annual Exam    Chronic Conditions/illness Management     Subjective: Maria [name] is a 53 y.o.  Female  present for CPE and chronic condition management All past medical history, surgical history, allergies, family history, immunizations, medications and social history were updated in the electronic medical record today. All recent labs, ED visits and hospitalizations within the last year were reviewed.  Health maintenance:  Colonoscopy: Cologuard completed 09/17/2021-normal, repeat 3 years Mammogram:FHX present in mother. completed 05/26/2023 [facility]> ordered for 2025 Cervical cancer screening: h/o hysterectomy Immunizations: tdap updated today 2015, covid completed, flu shot UTD.  Shingrix  completed, pneumonia 20 completed. Infectious disease screening: Hep c completed  Patient has a Dental home. Hospitalizations/ED visits: Reviewed  Diabetes with hyperlipidemia: Diagnosed with diabetes 2023 with an A1c of 6.7 patient reports compliance with Mounjaro  10 mg weekly. Patient denies dizziness, hyperglycemic or hypoglycemic events. Patient denies numbness, tingling in the extremities or nonhealing wounds of feet.   Hypertension: Patient reports compliance with losartan  25 mg daily. Patient denies chest pain, shortness of breath, dizziness or lower extremity edema.    Anxiety Patient reports compliance with Lexapro  10 mg daily is working well for her. Prior note: Patient reports she has been noting more irritability recently.  She states she is noted to be quick to anger, but her  irritability has been even less.  At one time she was taking Lexapro  10 mg daily, and she felt it worked well for her.  She states she thinks she would like to get back on that medicine today.    01/03/2024    2:22 PM 03/01/2023    7:48 AM 12/27/2022   11:41 AM 09/14/2022    9:53 AM 09/11/2021    1:08 PM  Depression screen PHQ 2/9  Decreased Interest 0 0 0 1 0  Down, Depressed, Hopeless 0 0 0 1 0  PHQ - 2 Score 0 0 0 2 0  Altered sleeping 0   1   Tired, decreased energy 0   1   Change in appetite 0   1   Feeling bad or failure about yourself  0   1   Trouble concentrating 0   1   Moving slowly or fidgety/restless 0   0   Suicidal thoughts 0   0   PHQ-9 Score 0   7       03/01/2023    7:48 AM 09/14/2022    9:54 AM 06/26/2019    8:36 AM 12/26/2018    1:02 PM  GAD 7 : Generalized Anxiety Score  Nervous, Anxious, on Edge 1 3 0 0  Control/stop worrying 1 3 0 0  Worry too much - different things 1 3 0 0  Trouble relaxing 1 3 0 1  Restless 1 3 0 0  Easily annoyed or irritable 1 3 1  0  Afraid - awful might happen 0 0 0 0  Total GAD 7 Score 6 18 1 1   Anxiety Difficulty Not difficult at all  Not difficult at all Not difficult at all  Immunization History  Administered Date(s) Administered   Influenza Split 04/17/2012   Influenza, Seasonal, Injecte, Preservative Fre 03/01/2023   Influenza,inj,Quad PF,6+ Mos 03/28/2014, 01/26/2016, 03/29/2018, 02/27/2019, 05/01/2021   Influenza-Unspecified 03/16/2017, 03/29/2018, 02/27/2019   PFIZER(Purple Top)SARS-COV-2 Vaccination 08/24/2019, 09/18/2019   PNEUMOCOCCAL CONJUGATE-20 07/05/2023   Pfizer Covid-19 Vaccine Bivalent Booster 69yrs & up 05/01/2021   Tdap 04/30/2014, 01/03/2024   Zoster Recombinant(Shingrix ) 09/11/2021, 02/28/2022    Past Medical History:  Diagnosis Date   Abnormal mammogram of left breast 12/29/2018   01/10/2019: ADDENDUM: Pathology revealed FIBROCYSTIC CHANGE, INCLUDING FIBRO-ADENOMATOID CHANGE AND DYSTROPHIC  CALCIFICATIONS of the LEFT breast, lower outer quadrant. This was found to be concordant by Dr. Dina Arceo.   The patient was instructed to return for annual screening mammography and informed a reminder notice would be sent regarding this appointment.   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  No further workup required 05/31/2009   Hypertension    Radicular pain of lower extremity 12/26/2018   Snoring 07/05/2023   Vaginal prolapse 2017   Allergies  Allergen Reactions   Oxycodone -Acetaminophen  Anaphylaxis   Asa [Aspirin] Nausea And Vomiting   Past Surgical History:  Procedure Laterality Date   BREAST BIOPSY Left 2020   OOPHORECTOMY Right 07/23/2015   Dr Evangeline   surgical breast clip Left 01/05/2019   TOTAL ABDOMINAL HYSTERECTOMY  10/2015   BSO also; endometriosis   Family History  Problem Relation Age of Onset   Hyperlipidemia Maternal Grandmother    Hypertension Maternal Grandmother    Diabetes Maternal Grandfather    Psoriasis Maternal Grandfather    Heart disease Maternal Grandfather    Hyperlipidemia Maternal Grandfather    Hypertension Maternal Grandfather    Heart disease Paternal Grandfather    Breast cancer Mother 13   Prostate cancer Father    Social History   Social History Narrative   Married with 2 children.   Smoke alarm in the home, wears her seatbelt.   Regular exercise:  Walks once a week   Caffeine use:  1-2 cups coffee daily   She works as a Agricultural engineer.    She has an associates degree.   Enjoys crafting/crocheting/Nascar   Feels safe in her relationships.    Allergies as of 01/03/2024       Reactions   Oxycodone -acetaminophen  Anaphylaxis   Asa [aspirin] Nausea And Vomiting        Medication List        Accurate as of January 03, 2024  2:45 PM. If you have any questions, ask your nurse or doctor.           STOP taking these medications    BIOTIN PO Stopped by: Charlies Bellini       TAKE these medications    atorvastatin  20 MG tablet Commonly known as: LIPITOR Take 1 tablet (20 mg total) by mouth daily.   clotrimazole  1 % cream Commonly known as: Clotrimazole  Anti-Fungal Apply 1 Application topically 2 (two) times daily. To abdominal fold   escitalopram  10 MG tablet Commonly known as: Lexapro  Take 1 tablet (10 mg total) by mouth daily.   losartan  25 MG tablet Commonly known as: COZAAR  Take 1 tablet (25 mg total) by mouth daily.   multivitamin capsule Take 1 capsule by mouth daily.   tirzepatide  10 MG/0.5ML Pen Commonly known as: MOUNJARO  Inject 10 mg into the  skin once a week.        All past medical history, surgical history, allergies, family history, immunizations andmedications were updated in the EMR today and reviewed under the history and medication portions of their EMR.     No results found for this or any previous visit (from the past 2160 hours).     ROS 14 pt review of systems performed and negative (unless mentioned in an HPI)  Objective: BP 122/80   Pulse 72   Temp 98.1 F (36.7 C)   Wt 185 lb 3.2 oz (84 kg)   LMP 05/28/2015 (Exact Date)   SpO2 97%   BMI 34.08 kg/m  Physical Exam Vitals and nursing note reviewed.  Constitutional:      General: She is not in acute distress.    Appearance: Normal appearance. She is obese. She is not ill-appearing or toxic-appearing.  HENT:     Head: Normocephalic and atraumatic.     Right Ear: Tympanic membrane, ear canal and external ear normal. There is no impacted cerumen.     Left Ear: Tympanic membrane, ear canal and external ear normal. There is no impacted cerumen.     Nose: No congestion or rhinorrhea.     Mouth/Throat:     Mouth: Mucous membranes are moist.     Pharynx: Oropharynx is clear. No oropharyngeal exudate or posterior oropharyngeal erythema.  Eyes:     General: No scleral icterus.        Right eye: No discharge.        Left eye: No discharge.     Extraocular Movements: Extraocular movements intact.     Conjunctiva/sclera: Conjunctivae normal.     Pupils: Pupils are equal, round, and reactive to light.  Neck:     Comments: Mildly enlarged right submandibular node, nontender. Mildly enlarged Left non-tender mobile anterior cervical lymph node present Cardiovascular:     Rate and Rhythm: Normal rate and regular rhythm.     Pulses: Normal pulses.     Heart sounds: Normal heart sounds. No murmur heard.    No friction rub. No gallop.  Pulmonary:     Effort: Pulmonary effort is normal. No respiratory distress.     Breath sounds: Normal breath sounds. No stridor. No wheezing, rhonchi or rales.  Chest:     Chest wall: No tenderness.  Abdominal:     General: Abdomen is flat. Bowel sounds are normal. There is no distension.     Palpations: Abdomen is soft. There is no mass.     Tenderness: There is no abdominal tenderness. There is no right CVA tenderness, left CVA tenderness, guarding or rebound.     Hernia: No hernia is present.  Musculoskeletal:        General: No swelling, tenderness or deformity. Normal range of motion.     Cervical back: Normal range of motion and neck supple. No rigidity or tenderness.     Right lower leg: No edema.     Left lower leg: No edema.  Lymphadenopathy:     Cervical: No cervical adenopathy.  Skin:    General: Skin is warm and dry.     Coloration: Skin is not jaundiced or pale.     Findings: No bruising, erythema, lesion or rash.  Neurological:     General: No focal deficit present.     Mental Status: She is alert and oriented to person, place, and time. Mental status is at baseline.     Cranial Nerves: No cranial nerve  deficit.     Sensory: No sensory deficit.     Motor: No weakness.     Coordination: Coordination normal.     Gait: Gait normal.     Deep Tendon Reflexes: Reflexes normal.  Psychiatric:        Mood and Affect: Mood  normal.        Behavior: Behavior normal.        Thought Content: Thought content normal.        Judgment: Judgment normal.     Diabetic Foot Exam - Simple   No data filed     No results found.  Assessment/plan: Maria [name] is a 53 y.o. female present for CPE and chronic condition management Anxiety: Stable Continue Lexapro  10 mg daily  Hypertension/obesity/hyperlipidemia: Stable Goal<130/80 Continue losartan  25 mg daily Continue lipitor 20 mg qhs  Type 2 diabetes mellitus with hyperlipidemia/obesity Stable Continue Mounjaro  10 mg weekly (mild-mod fatigue day after shot) PNA series: UTD 07/2023 Flu shot: UTD 2024 (recommneded yearly) Foot exam: Completed 07/05/2023 Urine microalbumin: Completed 2//2025 Eye exam: Completed 07/2022 in [place] , attempting to obtain records-reminded patient eye exam is due Lbs: 211>194>192>190> 185 today A1c: 6.7>7.5> 6.3 > 6.1 > A1c collected today   Morbid obesity (HCC) - Vitamin D  (25 hydroxy) Need for Tdap vaccination - Tdap vaccine greater than or equal to 7yo IM Breast cancer screening by mammogram - MM 3D SCREENING MAMMOGRAM BILATERAL BREAST; Future  LAD (lymphadenopathy) X2 mildly enlarged nodes of right submandibular and left ant cervical.> discussed monitoring. She has allergy signs today. She will monitor at home, if any enlargement she will be seen , otherwise we will set a close follow up in 3 months for cmc and recheck nodes.    Routine general medical examination at a health care facility (Primary) - CBC - Comprehensive metabolic panel with GFR - TSH Patient was encouraged to exercise greater than 150 minutes a week. Patient was encouraged to choose a diet filled with fresh fruits and vegetables, and lean meats. AVS provided to patient today for education/recommendation on gender specific health and safety maintenance. Colonoscopy: Cologuard completed 09/17/2021-normal, repeat 3 years Mammogram:FHX present in mother.  completed 05/26/2023 [facility]> ordered for 2025 Cervical cancer screening: h/o hysterectomy Immunizations: tdap updated today 2015, covid completed, flu shot UTD.  Shingrix  completed, pneumonia 20 completed. Infectious disease screening: Hep c completed    Orders Placed This Encounter  Procedures   MM 3D SCREENING MAMMOGRAM BILATERAL BREAST   Tdap vaccine greater than or equal to 7yo IM   CBC   Comprehensive metabolic panel with GFR   Hemoglobin A1c   Lipid panel   TSH   Urine Microalbumin w/creat. ratio   Vitamin D  (25 hydroxy)   Meds ordered this encounter  Medications   escitalopram  (LEXAPRO ) 10 MG tablet    Sig: Take 1 tablet (10 mg total) by mouth daily.    Dispense:  90 tablet    Refill:  1   losartan  (COZAAR ) 25 MG tablet    Sig: Take 1 tablet (25 mg total) by mouth daily.    Dispense:  90 tablet    Refill:  1   tirzepatide  (MOUNJARO ) 10 MG/0.5ML Pen    Sig: Inject 10 mg into the skin once a week.    Dispense:  2 mL    Refill:  5   atorvastatin  (LIPITOR) 20 MG tablet    Sig: Take 1 tablet (20 mg total) by mouth daily.    Dispense:  90 tablet  Refill:  3   Referral Orders  No referral(s) requested today      Electronically signed by: Charlies Bellini, DO [facility] [place]

## 2024-01-03 NOTE — Patient Instructions (Addendum)

## 2024-01-04 ENCOUNTER — Ambulatory Visit: Payer: Self-pay | Admitting: Family Medicine

## 2024-01-04 LAB — VITAMIN D 25 HYDROXY (VIT D DEFICIENCY, FRACTURES): VITD: 19.21 ng/mL — ABNORMAL LOW (ref 30.00–100.00)

## 2024-01-04 LAB — TSH: TSH: 2.12 u[IU]/mL (ref 0.35–5.50)

## 2024-01-04 MED ORDER — VITAMIN D (ERGOCALCIFEROL) 1.25 MG (50000 UNIT) PO CAPS
50000.0000 [IU] | ORAL_CAPSULE | ORAL | 0 refills | Status: AC
Start: 2024-01-04 — End: ?

## 2024-03-24 ENCOUNTER — Other Ambulatory Visit: Payer: Self-pay | Admitting: Family Medicine

## 2024-03-26 ENCOUNTER — Encounter: Payer: Self-pay | Admitting: Family Medicine

## 2024-03-26 ENCOUNTER — Other Ambulatory Visit: Payer: Self-pay

## 2024-03-26 MED ORDER — TIRZEPATIDE 10 MG/0.5ML ~~LOC~~ SOAJ: 10.0000 mg | SUBCUTANEOUS | 0 refills | Status: AC

## 2024-04-11 ENCOUNTER — Ambulatory Visit: Admitting: Family Medicine

## 2024-06-08 ENCOUNTER — Ambulatory Visit
Admission: RE | Admit: 2024-06-08 | Discharge: 2024-06-08 | Disposition: A | Source: Ambulatory Visit | Attending: Family Medicine | Admitting: Family Medicine

## 2024-06-08 DIAGNOSIS — Z1231 Encounter for screening mammogram for malignant neoplasm of breast: Secondary | ICD-10-CM
